# Patient Record
Sex: Male | Born: 1950 | Race: White | Hispanic: No | Marital: Single | Smoking: Former smoker
Health system: Southern US, Community
[De-identification: ages and names within clinical notes are randomized; demographics above are authoritative.]

## PROBLEM LIST (undated history)

## (undated) DIAGNOSIS — R739 Hyperglycemia, unspecified: Secondary | ICD-10-CM

## (undated) DIAGNOSIS — E782 Mixed hyperlipidemia: Secondary | ICD-10-CM

## (undated) DIAGNOSIS — E039 Hypothyroidism, unspecified: Secondary | ICD-10-CM

## (undated) DIAGNOSIS — M419 Scoliosis, unspecified: Secondary | ICD-10-CM

## (undated) DIAGNOSIS — Z79891 Long term (current) use of opiate analgesic: Secondary | ICD-10-CM

## (undated) DIAGNOSIS — N3941 Urge incontinence: Secondary | ICD-10-CM

## (undated) HISTORY — DX: Long term (current) use of opiate analgesic: Z79.891

## (undated) HISTORY — PX: HEMORRHOID SURGERY: SHX153

## (undated) HISTORY — DX: Hyperglycemia, unspecified: R73.9

## (undated) HISTORY — DX: Scoliosis, unspecified: M41.9

## (undated) HISTORY — PX: HERNIA REPAIR: SHX51

## (undated) HISTORY — PX: RHINOPLASTY: SUR1284

## (undated) HISTORY — DX: Urge incontinence: N39.41

## (undated) HISTORY — DX: Morbid (severe) obesity due to excess calories: E66.01

## (undated) HISTORY — DX: Mixed hyperlipidemia: E78.2

## (undated) HISTORY — PX: GASTRIC BYPASS: SHX52

## (undated) HISTORY — DX: Hypothyroidism, unspecified: E03.9

---

## 2005-09-28 ENCOUNTER — Encounter: Admission: RE | Admit: 2005-09-28 | Discharge: 2005-09-28 | Payer: Self-pay | Admitting: Neurosurgery

## 2012-07-28 DIAGNOSIS — Z9884 Bariatric surgery status: Secondary | ICD-10-CM | POA: Insufficient documentation

## 2012-07-28 DIAGNOSIS — M419 Scoliosis, unspecified: Secondary | ICD-10-CM | POA: Insufficient documentation

## 2012-07-28 DIAGNOSIS — M1990 Unspecified osteoarthritis, unspecified site: Secondary | ICD-10-CM | POA: Insufficient documentation

## 2012-07-28 DIAGNOSIS — E785 Hyperlipidemia, unspecified: Secondary | ICD-10-CM | POA: Insufficient documentation

## 2012-07-28 DIAGNOSIS — G8929 Other chronic pain: Secondary | ICD-10-CM | POA: Insufficient documentation

## 2012-07-28 DIAGNOSIS — R2681 Unsteadiness on feet: Secondary | ICD-10-CM | POA: Insufficient documentation

## 2013-10-12 ENCOUNTER — Other Ambulatory Visit: Payer: Self-pay | Admitting: *Deleted

## 2013-10-12 DIAGNOSIS — M542 Cervicalgia: Secondary | ICD-10-CM

## 2013-10-16 ENCOUNTER — Other Ambulatory Visit: Payer: Self-pay

## 2013-10-21 ENCOUNTER — Ambulatory Visit
Admission: RE | Admit: 2013-10-21 | Discharge: 2013-10-21 | Disposition: A | Discharge status: Home / Self Care | Payer: Medicare Other | Source: Ambulatory Visit | Attending: *Deleted | Admitting: *Deleted

## 2013-10-21 DIAGNOSIS — M542 Cervicalgia: Secondary | ICD-10-CM

## 2013-12-14 ENCOUNTER — Other Ambulatory Visit: Payer: Self-pay | Admitting: *Deleted

## 2013-12-14 DIAGNOSIS — M549 Dorsalgia, unspecified: Secondary | ICD-10-CM

## 2013-12-26 ENCOUNTER — Ambulatory Visit
Admission: RE | Admit: 2013-12-26 | Discharge: 2013-12-26 | Disposition: A | Discharge status: Home / Self Care | Payer: Medicare Other | Source: Ambulatory Visit | Attending: *Deleted | Admitting: *Deleted

## 2013-12-26 DIAGNOSIS — M549 Dorsalgia, unspecified: Secondary | ICD-10-CM

## 2015-07-23 ENCOUNTER — Inpatient Hospital Stay: Admission: RE | Admit: 2015-07-23 | Payer: Self-pay | Source: Ambulatory Visit

## 2015-07-23 ENCOUNTER — Other Ambulatory Visit: Payer: Self-pay | Admitting: Family Medicine

## 2015-07-23 DIAGNOSIS — R319 Hematuria, unspecified: Secondary | ICD-10-CM

## 2018-03-30 DIAGNOSIS — I83893 Varicose veins of bilateral lower extremities with other complications: Secondary | ICD-10-CM | POA: Insufficient documentation

## 2018-03-30 DIAGNOSIS — I872 Venous insufficiency (chronic) (peripheral): Secondary | ICD-10-CM | POA: Insufficient documentation

## 2018-12-13 DIAGNOSIS — M25559 Pain in unspecified hip: Secondary | ICD-10-CM | POA: Diagnosis not present

## 2018-12-13 DIAGNOSIS — M19079 Primary osteoarthritis, unspecified ankle and foot: Secondary | ICD-10-CM | POA: Diagnosis not present

## 2018-12-13 DIAGNOSIS — R262 Difficulty in walking, not elsewhere classified: Secondary | ICD-10-CM | POA: Diagnosis not present

## 2018-12-13 DIAGNOSIS — G890 Central pain syndrome: Secondary | ICD-10-CM | POA: Diagnosis not present

## 2019-01-17 DIAGNOSIS — M25569 Pain in unspecified knee: Secondary | ICD-10-CM | POA: Diagnosis not present

## 2019-01-17 DIAGNOSIS — M19079 Primary osteoarthritis, unspecified ankle and foot: Secondary | ICD-10-CM | POA: Diagnosis not present

## 2019-01-17 DIAGNOSIS — R262 Difficulty in walking, not elsewhere classified: Secondary | ICD-10-CM | POA: Diagnosis not present

## 2019-01-17 DIAGNOSIS — G890 Central pain syndrome: Secondary | ICD-10-CM | POA: Diagnosis not present

## 2019-02-20 DIAGNOSIS — J301 Allergic rhinitis due to pollen: Secondary | ICD-10-CM | POA: Diagnosis not present

## 2019-02-23 DIAGNOSIS — Z03818 Encounter for observation for suspected exposure to other biological agents ruled out: Secondary | ICD-10-CM | POA: Diagnosis not present

## 2019-03-15 DIAGNOSIS — M25559 Pain in unspecified hip: Secondary | ICD-10-CM | POA: Diagnosis not present

## 2019-03-15 DIAGNOSIS — M19079 Primary osteoarthritis, unspecified ankle and foot: Secondary | ICD-10-CM | POA: Diagnosis not present

## 2019-03-15 DIAGNOSIS — G890 Central pain syndrome: Secondary | ICD-10-CM | POA: Diagnosis not present

## 2019-03-16 DIAGNOSIS — G8929 Other chronic pain: Secondary | ICD-10-CM | POA: Diagnosis not present

## 2019-03-16 DIAGNOSIS — M1712 Unilateral primary osteoarthritis, left knee: Secondary | ICD-10-CM | POA: Diagnosis not present

## 2019-03-16 DIAGNOSIS — M5106 Intervertebral disc disorders with myelopathy, lumbar region: Secondary | ICD-10-CM | POA: Diagnosis not present

## 2019-03-16 DIAGNOSIS — E039 Hypothyroidism, unspecified: Secondary | ICD-10-CM | POA: Diagnosis not present

## 2019-03-16 DIAGNOSIS — E782 Mixed hyperlipidemia: Secondary | ICD-10-CM | POA: Diagnosis not present

## 2019-04-18 DIAGNOSIS — G890 Central pain syndrome: Secondary | ICD-10-CM | POA: Diagnosis not present

## 2019-04-18 DIAGNOSIS — M25559 Pain in unspecified hip: Secondary | ICD-10-CM | POA: Diagnosis not present

## 2019-04-18 DIAGNOSIS — M19079 Primary osteoarthritis, unspecified ankle and foot: Secondary | ICD-10-CM | POA: Diagnosis not present

## 2019-05-03 DIAGNOSIS — E039 Hypothyroidism, unspecified: Secondary | ICD-10-CM | POA: Diagnosis not present

## 2019-05-17 DIAGNOSIS — M19079 Primary osteoarthritis, unspecified ankle and foot: Secondary | ICD-10-CM | POA: Diagnosis not present

## 2019-05-17 DIAGNOSIS — M25559 Pain in unspecified hip: Secondary | ICD-10-CM | POA: Diagnosis not present

## 2019-05-17 DIAGNOSIS — G890 Central pain syndrome: Secondary | ICD-10-CM | POA: Diagnosis not present

## 2019-06-18 DIAGNOSIS — M25559 Pain in unspecified hip: Secondary | ICD-10-CM | POA: Diagnosis not present

## 2019-06-18 DIAGNOSIS — M19079 Primary osteoarthritis, unspecified ankle and foot: Secondary | ICD-10-CM | POA: Diagnosis not present

## 2019-06-18 DIAGNOSIS — G890 Central pain syndrome: Secondary | ICD-10-CM | POA: Diagnosis not present

## 2019-07-16 DIAGNOSIS — E782 Mixed hyperlipidemia: Secondary | ICD-10-CM | POA: Diagnosis not present

## 2019-07-16 DIAGNOSIS — R1032 Left lower quadrant pain: Secondary | ICD-10-CM | POA: Diagnosis not present

## 2019-07-16 DIAGNOSIS — G8929 Other chronic pain: Secondary | ICD-10-CM | POA: Diagnosis not present

## 2019-07-16 DIAGNOSIS — M5106 Intervertebral disc disorders with myelopathy, lumbar region: Secondary | ICD-10-CM | POA: Diagnosis not present

## 2019-07-16 DIAGNOSIS — E039 Hypothyroidism, unspecified: Secondary | ICD-10-CM | POA: Diagnosis not present

## 2019-07-16 DIAGNOSIS — Z1159 Encounter for screening for other viral diseases: Secondary | ICD-10-CM | POA: Diagnosis not present

## 2019-07-23 DIAGNOSIS — M19079 Primary osteoarthritis, unspecified ankle and foot: Secondary | ICD-10-CM | POA: Diagnosis not present

## 2019-07-23 DIAGNOSIS — M25559 Pain in unspecified hip: Secondary | ICD-10-CM | POA: Diagnosis not present

## 2019-07-23 DIAGNOSIS — G890 Central pain syndrome: Secondary | ICD-10-CM | POA: Diagnosis not present

## 2019-08-24 DIAGNOSIS — Z79891 Long term (current) use of opiate analgesic: Secondary | ICD-10-CM | POA: Diagnosis not present

## 2019-08-24 DIAGNOSIS — R262 Difficulty in walking, not elsewhere classified: Secondary | ICD-10-CM | POA: Diagnosis not present

## 2019-08-24 DIAGNOSIS — G890 Central pain syndrome: Secondary | ICD-10-CM | POA: Diagnosis not present

## 2019-08-24 DIAGNOSIS — M19079 Primary osteoarthritis, unspecified ankle and foot: Secondary | ICD-10-CM | POA: Diagnosis not present

## 2019-09-21 DIAGNOSIS — G890 Central pain syndrome: Secondary | ICD-10-CM | POA: Diagnosis not present

## 2019-09-21 DIAGNOSIS — Z79891 Long term (current) use of opiate analgesic: Secondary | ICD-10-CM | POA: Diagnosis not present

## 2019-09-21 DIAGNOSIS — M19079 Primary osteoarthritis, unspecified ankle and foot: Secondary | ICD-10-CM | POA: Diagnosis not present

## 2019-09-21 DIAGNOSIS — M25569 Pain in unspecified knee: Secondary | ICD-10-CM | POA: Diagnosis not present

## 2019-10-01 DIAGNOSIS — Z23 Encounter for immunization: Secondary | ICD-10-CM | POA: Diagnosis not present

## 2019-10-22 DIAGNOSIS — R262 Difficulty in walking, not elsewhere classified: Secondary | ICD-10-CM | POA: Diagnosis not present

## 2019-10-22 DIAGNOSIS — Z79891 Long term (current) use of opiate analgesic: Secondary | ICD-10-CM | POA: Diagnosis not present

## 2019-10-22 DIAGNOSIS — M4712 Other spondylosis with myelopathy, cervical region: Secondary | ICD-10-CM | POA: Diagnosis not present

## 2019-10-22 DIAGNOSIS — M25559 Pain in unspecified hip: Secondary | ICD-10-CM | POA: Diagnosis not present

## 2019-11-20 DIAGNOSIS — M25559 Pain in unspecified hip: Secondary | ICD-10-CM | POA: Diagnosis not present

## 2019-11-20 DIAGNOSIS — Z79891 Long term (current) use of opiate analgesic: Secondary | ICD-10-CM | POA: Diagnosis not present

## 2019-11-20 DIAGNOSIS — M4712 Other spondylosis with myelopathy, cervical region: Secondary | ICD-10-CM | POA: Diagnosis not present

## 2019-11-20 DIAGNOSIS — R262 Difficulty in walking, not elsewhere classified: Secondary | ICD-10-CM | POA: Diagnosis not present

## 2020-02-26 ENCOUNTER — Other Ambulatory Visit: Payer: Self-pay | Admitting: Legal Medicine

## 2020-03-20 DIAGNOSIS — G890 Central pain syndrome: Secondary | ICD-10-CM | POA: Diagnosis not present

## 2020-03-20 DIAGNOSIS — R262 Difficulty in walking, not elsewhere classified: Secondary | ICD-10-CM | POA: Diagnosis not present

## 2020-03-20 DIAGNOSIS — Z79891 Long term (current) use of opiate analgesic: Secondary | ICD-10-CM | POA: Diagnosis not present

## 2020-03-20 DIAGNOSIS — M19079 Primary osteoarthritis, unspecified ankle and foot: Secondary | ICD-10-CM | POA: Diagnosis not present

## 2020-04-01 ENCOUNTER — Ambulatory Visit (INDEPENDENT_AMBULATORY_CARE_PROVIDER_SITE_OTHER): Payer: Medicare Other | Admitting: Legal Medicine

## 2020-04-01 ENCOUNTER — Other Ambulatory Visit: Payer: Self-pay

## 2020-04-01 ENCOUNTER — Encounter: Payer: Self-pay | Admitting: Legal Medicine

## 2020-04-01 DIAGNOSIS — Z79891 Long term (current) use of opiate analgesic: Secondary | ICD-10-CM

## 2020-04-01 DIAGNOSIS — N3941 Urge incontinence: Secondary | ICD-10-CM | POA: Diagnosis not present

## 2020-04-01 DIAGNOSIS — E039 Hypothyroidism, unspecified: Secondary | ICD-10-CM | POA: Diagnosis not present

## 2020-04-01 DIAGNOSIS — R739 Hyperglycemia, unspecified: Secondary | ICD-10-CM | POA: Diagnosis not present

## 2020-04-01 DIAGNOSIS — E782 Mixed hyperlipidemia: Secondary | ICD-10-CM | POA: Diagnosis not present

## 2020-04-01 DIAGNOSIS — Z6841 Body Mass Index (BMI) 40.0 and over, adult: Secondary | ICD-10-CM

## 2020-04-01 DIAGNOSIS — R2232 Localized swelling, mass and lump, left upper limb: Secondary | ICD-10-CM

## 2020-04-01 MED ORDER — FAMOTIDINE 40 MG PO TABS: 40.0000 mg | ORAL_TABLET | Freq: Every day | ORAL | 2 refills | Status: DC

## 2020-04-01 NOTE — Assessment & Plan Note (Signed)
An individualize plan was formulated using patient history and physical exam to encourage weight loss.  An evidence based program was formulated.  Patient is to cut portion size with meals and to plan physical exercise 3 days a week at least 20 minutes.  Weight watchers and other programs are helpful.  Planned amount of weight loss 20 lbs. 

## 2020-04-01 NOTE — Assessment & Plan Note (Signed)
AN INDIVIDUAL CARE PLAN for incontinence of urine was established and reinforced today.  The patient's status was assessed using clinical findings on exam, labs, and other diagnostic testing. Patient's success at meeting treatment goals based on disease specific evidence-bassed guidelines and found to be in good control. RECOMMENDATIONS include maintaining present medicines and treatment.

## 2020-04-01 NOTE — Assessment & Plan Note (Signed)

## 2020-04-01 NOTE — Assessment & Plan Note (Signed)
New mass left forearm, get ultrasound but we discussed it may need removing.

## 2020-04-01 NOTE — Progress Notes (Signed)
Established Patient Office Visit  Subjective:  Patient ID: Antonio Evans, male    DOB: 11-16-1951  Age: 69 y.o. MRN: [id number]  CC:  Chief Complaint  Patient presents with  . Hypothyroidism  . Hyperlipidemia  . Hyperglycemia    HPI Antonio Evans presents for Chronic visit.  Patient has HYPOTHYROIDISM.  Diagnosed 10 years ago.  Patient has stable thyroid readings.  Patient is having no symptoms.  Last TSH was 4.52.  continue dosage of thyroid medicine.  Patient presents for follow up of hypertension.  Patient tolerating metoazone well with side effects.  Patient was diagnosed with hypertension 2010 so has been treated for hypertension for 10 years.Patient is working on maintaining diet and exercise regimen and follows up as directed. Complication include none  He has hyperglycemia in past..  Patient noted enlarging mass left forearm if is 3 by 6-7 cm.  It is firm but freely mobile.  Non tender.  Past Medical History:  Diagnosis Date  . Hyperglycemia   . Hypothyroidism   . 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 Mixed hyperlipidemia   . Morbid obesity due to excess calories ([place])   . Scoliosis   . Urge incontinence     Past Surgical History:  Procedure Laterality Date  . GASTRIC BYPASS N/A   . HEMORRHOID SURGERY    . HERNIA REPAIR    . RHINOPLASTY      Family History  Problem Relation Age of Onset  . COPD Mother   . Lung cancer Father   . Diabetes Brother     Social History   Socioeconomic History  . Marital status: Single    Spouse name: Not on file  . Number of children: Not on file  . Years of education: Not on file  . Highest education level: Not on file  Occupational History  . Not on file  Tobacco Use  . Smoking status: Former Smoker    Types: Cigarettes    Quit date: 1984    Years since quitting: 37.3  . Smokeless tobacco: Never Used  Substance and Sexual Activity  . Alcohol use: Not Currently  . Drug use: Never  . Sexual activity:  Not on file  Other Topics Concern  . Not on file  Social History Narrative  . Not on file   Social Determinants of Health   Financial Resource Strain:   . Difficulty of Paying Living Expenses:   Food Insecurity:   . Worried About Charity fundraiser in the Last Year:   . Arboriculturist in the Last Year:   Transportation Needs:   . Film/video editor (Medical):   Marland Kitchen Lack of Transportation (Non-Medical):   Physical Activity:   . Days of Exercise per Week:   . Minutes of Exercise per Session:   Stress:   . Feeling of Stress :   Social Connections:   . Frequency of Communication with Friends and Family:   . Frequency of Social Gatherings with Friends and Family:   . Attends Religious Services:   . Active Member of Clubs or Organizations:   . Attends Archivist Meetings:   Marland Kitchen Marital Status:   Intimate Partner Violence:   . Fear of Current or Ex-Partner:   . Emotionally Abused:   Marland Kitchen Physically Abused:   . Sexually Abused:     Outpatient Medications Prior to Visit  Medication Sig Dispense Refill  . aspirin 81 MG EC tablet Take by mouth.    Marland Kitchen  Cholecalciferol 125 MCG (5000 UT) capsule Take by mouth.    . cyanocobalamin 1000 MCG tablet Take by mouth.    . famotidine (PEPCID) 40 MG tablet Take by mouth.    . furosemide (LASIX) 20 MG tablet     . levothyroxine (SYNTHROID) 150 MCG tablet     . metolazone (ZAROXOLYN) 5 MG tablet     . Oxycodone HCl 20 MG TABS     . potassium chloride SA (KLOR-CON) 20 MEQ tablet Take by mouth.    . pravastatin (PRAVACHOL) 40 MG tablet TAKE 1 TABLET BY MOUTH  TWICE DAILY 180 tablet 2  . tiZANidine (ZANAFLEX) 4 MG tablet Take by mouth.    . flunisolide (NASALIDE) 25 MCG/ACT (0.025%) SOLN Place into the nose.    . fluticasone (FLONASE) 50 MCG/ACT nasal spray USE 2 SPRAYS IN EACH NOSTRIL EVERY DAY    . Methylnaltrexone Bromide 150 MG TABS Take by mouth.    . montelukast (SINGULAIR) 10 MG tablet TAKE 1 TABLET EVERY NIGHT     No  facility-administered medications prior to visit.    Allergies  Allergen Reactions  . Enoxaparin Other (See Comments)    Other reaction(s): Bleeding (intolerance)  Other reaction(s): Bleeding (intolerance) Other reaction(s): Bleeding (intolerance)   . Pollen Extract Shortness Of Breath and Swelling  . Gabapentin Other (See Comments)    GI upset Other reaction(s): Other (See Comments) GI upset  GI upset Other reaction(s): Other (See Comments) GI upset Other reaction(s): Other (See Comments) GI upset GI upset   . Levofloxacin Rash  . Sulfamethoxazole-Trimethoprim Other (See Comments)    UNK  . Sulfamethoxazole Other (See Comments)    Other reaction(s): Other (See Comments)     ROS Review of Systems  Constitutional: Negative.   HENT: Negative.   Eyes: Negative.   Respiratory: Negative.   Cardiovascular: Negative.   Gastrointestinal: Negative.   Endocrine: Negative.   Genitourinary: Negative.   Musculoskeletal: Negative.   Skin:       Left forearm mass  Neurological: Negative.   Psychiatric/Behavioral: Negative.       Objective:    Physical Exam  Constitutional: He is oriented to person, place, and time. He appears well-developed and well-nourished.  HENT:  Head: Normocephalic and atraumatic.  Right Ear: External ear normal.  Left Ear: External ear normal.  Nose: Nose normal.  Mouth/Throat: Oropharynx is clear and moist.  Eyes: Pupils are equal, round, and reactive to light. Conjunctivae and EOM are normal.  Cardiovascular: Normal rate, regular rhythm, normal heart sounds and intact distal pulses.  Pulmonary/Chest: Effort normal and breath sounds normal.  Abdominal: Soft. Bowel sounds are normal.  Musculoskeletal:        General: Normal range of motion.     Cervical back: Normal range of motion and neck supple.     Comments: Left forearm mass, 3 by 6 to 7 cm, firm but mobile.  Nontender  Neurological: He is alert and oriented to person, place, and time.  He has normal reflexes.  Skin: Skin is warm and dry.  Psychiatric: He has a normal mood and affect. His behavior is normal. Thought content normal.  Vitals reviewed.   BP (!) 150/78   Pulse 70   Temp (!) 97 F (36.1 C)   Resp 16   Ht 6' (1.829 m)   Wt (!) 308 lb 6.4 oz (139.9 kg)   SpO2 95%   BMI 41.83 kg/m  Wt Readings from Last 3 Encounters:  04/01/20 (!) 308 lb 6.4   oz (139.9 kg)     Health Maintenance Due  Topic Date Due  . Hepatitis C Screening  Never done  . COVID-19 Vaccine (1) Never done  . COLONOSCOPY  Never done  . PNA vac Low Risk Adult (1 of 2 - PCV13) Never done    There are no preventive care reminders to display for this patient.  No results found for: TSH No results found for: WBC, HGB, HCT, MCV, PLT No results found for: NA, K, CHLORIDE, CO2, GLUCOSE, BUN, CREATININE, BILITOT, ALKPHOS, AST, ALT, PROT, ALBUMIN, CALCIUM, ANIONGAP, EGFR, GFR No results found for: CHOL No results found for: HDL No results found for: LDLCALC No results found for: TRIG No results found for: CHOLHDL No results found for: HGBA1C    Assessment & Plan:   Problem List Items Addressed This Visit      Endocrine   Hypothyroidism    Patient is known to have hypothyroid and is n treatment with levothyroxine 150.  Patient was diagnosed 10 years ago.  Other treatment includes none.  Patient is compliant with medicines and last TSH 6 months ago.  Last TSH was 4.52.      Relevant Medications   levothyroxine (SYNTHROID) 150 MCG tablet   Other Relevant Orders   TSH     Other   Hyperglycemia    Patient has been having high glucose but not diabetic, we will recheck A1c.      Relevant Orders   Hemoglobin A1c   Urge incontinence    AN INDIVIDUAL CARE PLAN for incontinence of urine was established and reinforced today.  The patient's status was assessed using clinical findings on exam, labs, and other diagnostic testing. Patient's success at meeting treatment goals based on  disease specific evidence-bassed guidelines and found to be in good control. RECOMMENDATIONS include maintaining present medicines and treatment.      Mixed hyperlipidemia    AN INDIVIDUAL CARE PLAN for hyperlipidemia/ cholesterol was established and reinforced today.  The patient's status was assessed using clinical findings on exam, lab and other diagnostic tests. The patient's disease status was assessed based on evidence-based guidelines and found to be well controlled. MEDICATIONS were reviewed. SELF MANAGEMENT GOALS have been discussed and patient's success at attaining the goal of low cholesterol was assessed. RECOMMENDATION given include regular exercise 3 days a week and low cholesterol/low fat diet. CLINICAL SUMMARY including written plan to identify barriers unique to the patient due to social or economic  reasons was discussed.      Relevant Medications   aspirin 81 MG EC tablet   furosemide (LASIX) 20 MG tablet   metolazone (ZAROXOLYN) 5 MG tablet   Other Relevant Orders   CBC with Differential/Platelet   Comprehensive metabolic panel   Lipid panel   Morbid obesity due to excess calories [facility])    An individualize plan was formulated using patient history and physical exam to encourage weight loss.  An evidence based program was formulated.  Patient is to cut portion size with meals and to plan physical exercise 3 days a week at least 20 minutes.  Weight watchers and other programs are helpful.  Planned amount of weight loss 20 lbs.      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  The patient's status was assessed using clinical findings on exam, labs, and other diagnostic testing. Patient's success at meeting treatment goals based on disease specific evidence-bassed guidelines and found  to be in fair control. RECOMMENDATIONS include maintining present medicines and treatment. He is on chronicoxycodone with no  abuse.  Negative REMS.      BMI 40.0-44.9, adult ([facility])    An individualize plan was formulated using patient history and physical exam to encourage weight loss.  An evidence based program was formulated.  Patient is to cut portion size with meals and to plan physical exercise 3 days a week at least 20 minutes.  Weight watchers and other programs are helpful.  Planned amount of weight loss 20 lbs.      Mass of left forearm    New mass left forearm, get ultrasound but we discussed it may need removing.      Relevant Orders   US UPPER EXTREMITY DUPLEX LEFT (NON-WBI)      No orders of the defined types were placed in this encounter.   Follow-up: Return in about 4 months (around 08/02/2020) for fasting.    Lawrence Perry, MD 

## 2020-04-01 NOTE — Assessment & Plan Note (Signed)
Patient has been having high glucose but not diabetic, we will recheck A1c.

## 2020-04-01 NOTE — Assessment & Plan Note (Signed)
AN INDIVIDUAL CARE PLAN was established and reinforced today.  The patient's status was assessed using clinical findings on exam, labs, and other diagnostic testing. Patient's success at meeting treatment goals based on disease specific evidence-bassed guidelines and found to be in fair control. RECOMMENDATIONS include maintining present medicines and treatment. He is on chronicoxycodone with no abuse.  Negative REMS. 

## 2020-04-01 NOTE — Assessment & Plan Note (Signed)
Patient is known to have hypothyroid and is n treatment with levothyroxine 150.  Patient was diagnosed 10 years ago.  Other treatment includes none.  Patient is compliant with medicines and last TSH 6 months ago.  Last TSH was 4.52.

## 2020-04-02 LAB — COMPREHENSIVE METABOLIC PANEL
ALT: 33 IU/L (ref 0–44)
AST: 29 IU/L (ref 0–40)
Albumin/Globulin Ratio: 2 (ref 1.2–2.2)
Albumin: 4.5 g/dL (ref 3.8–4.8)
Alkaline Phosphatase: 119 IU/L — ABNORMAL HIGH (ref 39–117)
BUN/Creatinine Ratio: 17 (ref 10–24)
BUN: 13 mg/dL (ref 8–27)
Bilirubin Total: 0.6 mg/dL (ref 0.0–1.2)
CO2: 25 mmol/L (ref 20–29)
Calcium: 9.7 mg/dL (ref 8.6–10.2)
Chloride: 104 mmol/L (ref 96–106)
Creatinine, Ser: 0.75 mg/dL — ABNORMAL LOW (ref 0.76–1.27)
GFR calc Af Amer: 110 mL/min/{1.73_m2} (ref >59)
GFR calc non Af Amer: 95 mL/min/{1.73_m2} (ref >59)
Globulin, Total: 2.2 g/dL (ref 1.5–4.5)
Glucose: 91 mg/dL (ref 65–99)
Potassium: 4.1 mmol/L (ref 3.5–5.2)
Sodium: 141 mmol/L (ref 134–144)
Total Protein: 6.7 g/dL (ref 6.0–8.5)

## 2020-04-02 LAB — CBC WITH DIFFERENTIAL/PLATELET
Basophils Absolute: 0.1 10*3/uL (ref 0.0–0.2)
Basos: 1 %
EOS (ABSOLUTE): 0.3 10*3/uL (ref 0.0–0.4)
Eos: 5 %
Hematocrit: 41.3 % (ref 37.5–51.0)
Hemoglobin: 13.3 g/dL (ref 13.0–17.7)
Immature Grans (Abs): 0 10*3/uL (ref 0.0–0.1)
Immature Granulocytes: 0 %
Lymphocytes Absolute: 0.9 10*3/uL (ref 0.7–3.1)
Lymphs: 17 %
MCH: 26.8 pg (ref 26.6–33.0)
MCHC: 32.2 g/dL (ref 31.5–35.7)
MCV: 83 fL (ref 79–97)
Monocytes Absolute: 0.5 10*3/uL (ref 0.1–0.9)
Monocytes: 9 %
Neutrophils Absolute: 3.5 10*3/uL (ref 1.4–7.0)
Neutrophils: 68 %
Platelets: 220 10*3/uL (ref 150–450)
RBC: 4.96 x10E6/uL (ref 4.14–5.80)
RDW: 13.3 % (ref 11.6–15.4)
WBC: 5.3 10*3/uL (ref 3.4–10.8)

## 2020-04-02 LAB — CARDIOVASCULAR RISK ASSESSMENT

## 2020-04-02 LAB — HEMOGLOBIN A1C
Est. average glucose Bld gHb Est-mCnc: 103 mg/dL
Hgb A1c MFr Bld: 5.2 % (ref 4.8–5.6)

## 2020-04-02 LAB — LIPID PANEL
Chol/HDL Ratio: 2.8 ratio (ref 0.0–5.0)
Cholesterol, Total: 102 mg/dL (ref 100–199)
HDL: 37 mg/dL — ABNORMAL LOW (ref >39)
LDL Chol Calc (NIH): 48 mg/dL (ref 0–99)
Triglycerides: 84 mg/dL (ref 0–149)
VLDL Cholesterol Cal: 17 mg/dL (ref 5–40)

## 2020-04-02 LAB — TSH: TSH: 3.12 u[IU]/mL (ref 0.450–4.500)

## 2020-04-02 NOTE — Progress Notes (Signed)
CBC normal, kidney tests normal, liver tests normal, A1c5.2, Cholesterol normal. lp

## 2020-04-07 ENCOUNTER — Other Ambulatory Visit: Payer: Self-pay

## 2020-04-07 MED ORDER — FUROSEMIDE 40 MG PO TABS: 40.0000 mg | ORAL_TABLET | Freq: Every day | ORAL | 1 refills | Status: DC | PRN

## 2020-04-08 ENCOUNTER — Encounter: Payer: Self-pay | Admitting: Legal Medicine

## 2020-04-08 ENCOUNTER — Other Ambulatory Visit: Payer: Self-pay

## 2020-04-08 ENCOUNTER — Ambulatory Visit (INDEPENDENT_AMBULATORY_CARE_PROVIDER_SITE_OTHER): Payer: Medicare Other | Admitting: Legal Medicine

## 2020-04-08 VITALS — BP 110/60 | HR 66 | Temp 97.3°F | Resp 18 | Ht 72.0 in | Wt 308.0 lb

## 2020-04-08 DIAGNOSIS — M545 Low back pain, unspecified: Secondary | ICD-10-CM

## 2020-04-08 DIAGNOSIS — M549 Dorsalgia, unspecified: Secondary | ICD-10-CM | POA: Insufficient documentation

## 2020-04-08 LAB — POCT URINALYSIS DIPSTICK
Bilirubin, UA: 1
Blood, UA: NEGATIVE
Glucose, UA: NEGATIVE
Ketones, UA: NEGATIVE
Leukocytes, UA: NEGATIVE
Nitrite, UA: NEGATIVE
Protein, UA: NEGATIVE
Spec Grav, UA: 1.025 (ref 1.010–1.025)
Urobilinogen, UA: 0.2 E.U./dL
pH, UA: 5.5 (ref 5.0–8.0)

## 2020-04-08 MED ORDER — KETOROLAC TROMETHAMINE 60 MG/2ML IM SOLN: 60.0000 mg | Freq: Once | INTRAMUSCULAR | Status: DC

## 2020-04-08 NOTE — Assessment & Plan Note (Signed)
Patient presented with left low back pain radiation to abdomen.  He has history of renal calculi but denies any back injury.  Plan is to get ct abdomen and pelvis with contrast tomorrow.

## 2020-04-08 NOTE — Progress Notes (Signed)
Established Patient Office Visit  Subjective:  Patient ID: Antonio Evans, male    DOB: Apr 06, 1951  Age: 69 y.o. MRN: [id number]  CC:  Chief Complaint  Patient presents with  . Back Pain    left lower    HPI Antonio Evans presents for back pain.  He has a history of chronic back pain and degenerative disk disease.  It is on left side radiating to anterior abdomen.  Pain worse with walking and coughing.  No fever or chills. He feels tired.  Eating OK.  Does not feel like old back pain. Pain started last week.  Past Medical History:  Diagnosis Date  . Hyperglycemia   . Hypothyroidism   . 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 Mixed hyperlipidemia   . Morbid obesity due to excess calories ([place])   . Scoliosis   . Urge incontinence     Past Surgical History:  Procedure Laterality Date  . GASTRIC BYPASS N/A   . HEMORRHOID SURGERY    . HERNIA REPAIR    . RHINOPLASTY      Family History  Problem Relation Age of Onset  . COPD Mother   . Lung cancer Father   . Diabetes Brother     Social History   Socioeconomic History  . Marital status: Single    Spouse name: Not on file  . Number of children: Not on file  . Years of education: Not on file  . Highest education level: Not on file  Occupational History  . Not on file  Tobacco Use  . Smoking status: Former Smoker    Types: Cigarettes    Quit date: 1984    Years since quitting: 37.3  . Smokeless tobacco: Never Used  Substance and Sexual Activity  . Alcohol use: Not Currently  . Drug use: Never  . Sexual activity: Not on file  Other Topics Concern  . Not on file  Social History Narrative  . Not on file   Social Determinants of Health   Financial Resource Strain:   . Difficulty of Paying Living Expenses:   Food Insecurity:   . Worried About Charity fundraiser in the Last Year:   . Arboriculturist in the Last Year:   Transportation Needs:   . Film/video editor (Medical):   Marland Kitchen Lack of  Transportation (Non-Medical):   Physical Activity:   . Days of Exercise per Week:   . Minutes of Exercise per Session:   Stress:   . Feeling of Stress :   Social Connections:   . Frequency of Communication with Friends and Family:   . Frequency of Social Gatherings with Friends and Family:   . Attends Religious Services:   . Active Member of Clubs or Organizations:   . Attends Archivist Meetings:   Marland Kitchen Marital Status:   Intimate Partner Violence:   . Fear of Current or Ex-Partner:   . Emotionally Abused:   Marland Kitchen Physically Abused:   . Sexually Abused:     Outpatient Medications Prior to Visit  Medication Sig Dispense Refill  . aspirin 81 MG EC tablet Take by mouth.    . Cholecalciferol 125 MCG (5000 UT) capsule Take by mouth.    . cyanocobalamin 1000 MCG tablet Take by mouth.    . famotidine (PEPCID) 40 MG tablet Take 1 tablet (40 mg total) by mouth daily. 90 tablet 2  . furosemide (LASIX) 40 MG tablet Take 1 tablet (40 mg  total) by mouth daily as needed. 90 tablet 1  . levothyroxine (SYNTHROID) 150 MCG tablet     . metolazone (ZAROXOLYN) 5 MG tablet     . Oxycodone HCl 20 MG TABS     . potassium chloride SA (KLOR-CON) 20 MEQ tablet Take by mouth.    . pravastatin (PRAVACHOL) 40 MG tablet TAKE 1 TABLET BY MOUTH  TWICE DAILY 180 tablet 2  . tiZANidine (ZANAFLEX) 4 MG tablet Take by mouth.     No facility-administered medications prior to visit.    Allergies  Allergen Reactions  . Enoxaparin Other (See Comments)    Other reaction(s): Bleeding (intolerance)  Other reaction(s): Bleeding (intolerance) Other reaction(s): Bleeding (intolerance)   . Pollen Extract Shortness Of Breath and Swelling  . Gabapentin Other (See Comments)    GI upset Other reaction(s): Other (See Comments) GI upset  GI upset Other reaction(s): Other (See Comments) GI upset Other reaction(s): Other (See Comments) GI upset GI upset   . Levofloxacin Rash  . Sulfamethoxazole-Trimethoprim  Other (See Comments)    UNK  . Sulfamethoxazole Other (See Comments)    Other reaction(s): Other (See Comments)     ROS Review of Systems  Constitutional: Negative.   HENT: Negative.   Eyes: Negative.   Respiratory: Negative.   Cardiovascular: Negative.   Gastrointestinal: Negative.   Genitourinary: Negative.   Musculoskeletal: Positive for back pain.  Neurological: Negative.   Psychiatric/Behavioral: Negative.       Objective:    Physical Exam  Constitutional: He appears well-developed and well-nourished.  HENT:  Head: Normocephalic and atraumatic.  Right Ear: External ear normal.  Left Ear: External ear normal.  Eyes: Pupils are equal, round, and reactive to light. Conjunctivae and EOM are normal.  Cardiovascular: Normal rate, regular rhythm, normal heart sounds and intact distal pulses.  Pulmonary/Chest: Effort normal and breath sounds normal.  Musculoskeletal:     Cervical back: Normal range of motion and neck supple.     Lumbar back: Pain and spasms present.       Back:     Comments: increase pain with SLR.  No deficits  Vitals reviewed.   BP 110/60   Pulse 66   Temp (!) 97.3 F (36.3 C)   Resp 18   Ht 6' (1.829 m)   Wt (!) 308 lb (139.7 kg)   SpO2 95%   BMI 41.77 kg/m  Wt Readings from Last 3 Encounters:  04/08/20 (!) 308 lb (139.7 kg)  04/01/20 (!) 308 lb 6.4 oz (139.9 kg)     Health Maintenance Due  Topic Date Due  . Hepatitis C Screening  Never done  . COVID-19 Vaccine (1) Never done  . COLONOSCOPY  Never done  . PNA vac Low Risk Adult (1 of 2 - PCV13) Never done    There are no preventive care reminders to display for this patient.  Lab Results  Component Value Date   TSH 3.120 04/01/2020   Lab Results  Component Value Date   WBC 5.3 04/01/2020   HGB 13.3 04/01/2020   HCT 41.3 04/01/2020   MCV 83 04/01/2020   PLT 220 04/01/2020   Lab Results  Component Value Date   NA 141 04/01/2020   K 4.1 04/01/2020   CO2 25 04/01/2020    GLUCOSE 91 04/01/2020   BUN 13 04/01/2020   CREATININE 0.75 (L) 04/01/2020   BILITOT 0.6 04/01/2020   ALKPHOS 119 (H) 04/01/2020   AST 29 04/01/2020   ALT 33 04/01/2020  PROT 6.7 04/01/2020   ALBUMIN 4.5 04/01/2020   CALCIUM 9.7 04/01/2020   Lab Results  Component Value Date   CHOL 102 04/01/2020   Lab Results  Component Value Date   HDL 37 (L) 04/01/2020   Lab Results  Component Value Date   LDLCALC 48 04/01/2020   Lab Results  Component Value Date   TRIG 84 04/01/2020   Lab Results  Component Value Date   CHOLHDL 2.8 04/01/2020   Lab Results  Component Value Date   HGBA1C 5.2 04/01/2020      Assessment & Plan:   Problem List Items Addressed This Visit      Other   Back pain - Primary    Patient presented with left low back pain radiation to abdomen.  He has history of renal calculi but denies any back injury.  Plan is to get ct abdomen and pelvis with contrast tomorrow.      Relevant Medications   ketorolac (TORADOL) injection 60 mg   Other Relevant Orders   CT Abdomen Pelvis W Contrast   POCT urinalysis dipstick (Completed)      Meds ordered this encounter  Medications  . ketorolac (TORADOL) injection 60 mg    Follow-up: No follow-ups on file.    [name], MD

## 2020-04-09 ENCOUNTER — Other Ambulatory Visit: Payer: Self-pay

## 2020-04-09 ENCOUNTER — Other Ambulatory Visit: Payer: Self-pay | Admitting: Family Medicine

## 2020-04-09 DIAGNOSIS — Z9884 Bariatric surgery status: Secondary | ICD-10-CM | POA: Diagnosis not present

## 2020-04-09 DIAGNOSIS — M545 Low back pain, unspecified: Secondary | ICD-10-CM

## 2020-04-09 DIAGNOSIS — I700 Atherosclerosis of aorta: Secondary | ICD-10-CM | POA: Diagnosis not present

## 2020-04-09 DIAGNOSIS — R109 Unspecified abdominal pain: Secondary | ICD-10-CM | POA: Diagnosis not present

## 2020-04-09 DIAGNOSIS — N200 Calculus of kidney: Secondary | ICD-10-CM | POA: Diagnosis not present

## 2020-04-09 DIAGNOSIS — M48061 Spinal stenosis, lumbar region without neurogenic claudication: Secondary | ICD-10-CM | POA: Diagnosis not present

## 2020-04-09 NOTE — Telephone Encounter (Signed)
Your pt

## 2020-04-16 DIAGNOSIS — M19079 Primary osteoarthritis, unspecified ankle and foot: Secondary | ICD-10-CM | POA: Diagnosis not present

## 2020-04-16 DIAGNOSIS — M4712 Other spondylosis with myelopathy, cervical region: Secondary | ICD-10-CM | POA: Diagnosis not present

## 2020-04-16 DIAGNOSIS — Z79891 Long term (current) use of opiate analgesic: Secondary | ICD-10-CM | POA: Diagnosis not present

## 2020-04-16 DIAGNOSIS — G890 Central pain syndrome: Secondary | ICD-10-CM | POA: Diagnosis not present

## 2020-05-23 DIAGNOSIS — Z79891 Long term (current) use of opiate analgesic: Secondary | ICD-10-CM | POA: Diagnosis not present

## 2020-05-23 DIAGNOSIS — G890 Central pain syndrome: Secondary | ICD-10-CM | POA: Diagnosis not present

## 2020-05-23 DIAGNOSIS — M19079 Primary osteoarthritis, unspecified ankle and foot: Secondary | ICD-10-CM | POA: Diagnosis not present

## 2020-05-23 DIAGNOSIS — M4712 Other spondylosis with myelopathy, cervical region: Secondary | ICD-10-CM | POA: Diagnosis not present

## 2020-05-27 ENCOUNTER — Other Ambulatory Visit: Payer: Self-pay | Admitting: Legal Medicine

## 2020-05-27 ENCOUNTER — Telehealth: Payer: Self-pay

## 2020-05-27 DIAGNOSIS — Z0100 Encounter for examination of eyes and vision without abnormal findings: Secondary | ICD-10-CM

## 2020-05-27 NOTE — Telephone Encounter (Signed)
Patient would like to see opthamologist Dr. Zadie Rhine. States he needs a referral to set up appoinment next week.

## 2020-06-01 ENCOUNTER — Other Ambulatory Visit: Payer: Self-pay | Admitting: Legal Medicine

## 2020-06-03 ENCOUNTER — Other Ambulatory Visit: Payer: Self-pay

## 2020-06-03 MED ORDER — FAMOTIDINE 40 MG PO TABS: 40.0000 mg | ORAL_TABLET | Freq: Two times a day (BID) | ORAL | 2 refills | Status: DC

## 2020-06-03 MED ORDER — TIZANIDINE HCL 4 MG PO TABS: 4.0000 mg | ORAL_TABLET | Freq: Three times a day (TID) | ORAL | 1 refills | Status: DC

## 2020-06-04 DIAGNOSIS — H2513 Age-related nuclear cataract, bilateral: Secondary | ICD-10-CM | POA: Diagnosis not present

## 2020-06-04 DIAGNOSIS — D3131 Benign neoplasm of right choroid: Secondary | ICD-10-CM | POA: Diagnosis not present

## 2020-06-04 DIAGNOSIS — H43393 Other vitreous opacities, bilateral: Secondary | ICD-10-CM | POA: Diagnosis not present

## 2020-06-04 DIAGNOSIS — H43813 Vitreous degeneration, bilateral: Secondary | ICD-10-CM | POA: Diagnosis not present

## 2020-06-24 DIAGNOSIS — M4712 Other spondylosis with myelopathy, cervical region: Secondary | ICD-10-CM | POA: Diagnosis not present

## 2020-06-24 DIAGNOSIS — Z79891 Long term (current) use of opiate analgesic: Secondary | ICD-10-CM | POA: Diagnosis not present

## 2020-06-24 DIAGNOSIS — G890 Central pain syndrome: Secondary | ICD-10-CM | POA: Diagnosis not present

## 2020-06-24 DIAGNOSIS — M19079 Primary osteoarthritis, unspecified ankle and foot: Secondary | ICD-10-CM | POA: Diagnosis not present

## 2020-07-25 DIAGNOSIS — M19079 Primary osteoarthritis, unspecified ankle and foot: Secondary | ICD-10-CM | POA: Diagnosis not present

## 2020-07-25 DIAGNOSIS — G890 Central pain syndrome: Secondary | ICD-10-CM | POA: Diagnosis not present

## 2020-07-25 DIAGNOSIS — M4712 Other spondylosis with myelopathy, cervical region: Secondary | ICD-10-CM | POA: Diagnosis not present

## 2020-07-25 DIAGNOSIS — Z79891 Long term (current) use of opiate analgesic: Secondary | ICD-10-CM | POA: Diagnosis not present

## 2020-07-31 ENCOUNTER — Other Ambulatory Visit: Payer: Self-pay

## 2020-07-31 ENCOUNTER — Encounter: Payer: Self-pay | Admitting: Legal Medicine

## 2020-07-31 ENCOUNTER — Ambulatory Visit (INDEPENDENT_AMBULATORY_CARE_PROVIDER_SITE_OTHER): Payer: Medicare Other | Admitting: Legal Medicine

## 2020-07-31 VITALS — BP 134/82 | HR 100 | Temp 97.4°F | Resp 16 | Ht 72.0 in | Wt 305.0 lb

## 2020-07-31 DIAGNOSIS — E782 Mixed hyperlipidemia: Secondary | ICD-10-CM | POA: Diagnosis not present

## 2020-07-31 DIAGNOSIS — Z23 Encounter for immunization: Secondary | ICD-10-CM

## 2020-07-31 DIAGNOSIS — M545 Low back pain, unspecified: Secondary | ICD-10-CM

## 2020-07-31 DIAGNOSIS — E038 Other specified hypothyroidism: Secondary | ICD-10-CM

## 2020-07-31 DIAGNOSIS — R739 Hyperglycemia, unspecified: Secondary | ICD-10-CM

## 2020-07-31 DIAGNOSIS — Z79891 Long term (current) use of opiate analgesic: Secondary | ICD-10-CM

## 2020-07-31 DIAGNOSIS — I83893 Varicose veins of bilateral lower extremities with other complications: Secondary | ICD-10-CM

## 2020-07-31 MED ORDER — FUROSEMIDE 40 MG PO TABS: 80.0000 mg | ORAL_TABLET | Freq: Every day | ORAL | 2 refills | Status: DC | PRN

## 2020-07-31 NOTE — Progress Notes (Signed)
Subjective:  Patient ID: Antonio Evans, male    DOB: Jun 27, 1951  Age: 69 y.o. MRN: [id number]  Chief Complaint  Patient presents with  . Hyperlipidemia  . Hypothyroidism    HPI: chronic visit  Patient has HYPOTHYROIDISM.  Diagnosed 10 years ago.  Patient has stable thyroid readings.  Patient is having stable.  Last TSH was normal.  continue dosage of thyroid medicine.  Patient presents with hyperlipidemia.  Compliance with treatment has been good; patient takes medicines as directed, maintains low cholesterol diet, follows up as directed, and maintains exercise regimen.  Patient is using pravastatin without problems.   Current Outpatient Medications on File Prior to Visit  Medication Sig Dispense Refill  . aspirin 81 MG EC tablet Take by mouth.    . Cholecalciferol 125 MCG (5000 UT) capsule Take by mouth.    . cyanocobalamin 1000 MCG tablet Take by mouth.    . famotidine (PEPCID) 40 MG tablet Take 1 tablet (40 mg total) by mouth 2 (two) times daily. 180 tablet 2  . levothyroxine (SYNTHROID) 150 MCG tablet TAKE 1 TABLET BY MOUTH ONCE DAILY 90 tablet 3  . metolazone (ZAROXOLYN) 5 MG tablet     . montelukast (SINGULAIR) 10 MG tablet TAKE 1 TABLET EVERY NIGHT    . morphine (MS CONTIN) 60 MG 12 hr tablet SMARTSIG:1 Tablet(s) By Mouth Every 12 Hours    . Multiple Vitamin (MULTI-VITAMIN) tablet Take by mouth.    . Oxycodone HCl 20 MG TABS     . potassium chloride SA (KLOR-CON) 20 MEQ tablet TAKE 1 TABLET BY MOUTH  TWICE DAILY WITH FOOD 180 tablet 3  . pravastatin (PRAVACHOL) 40 MG tablet TAKE 1 TABLET BY MOUTH  TWICE DAILY 180 tablet 2  . tiZANidine (ZANAFLEX) 4 MG tablet Take 1 tablet (4 mg total) by mouth 3 (three) times daily. 405 tablet 1   No current facility-administered medications on file prior to visit.   Past Medical History:  Diagnosis Date  . Hyperglycemia   . Hypothyroidism   . 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 Mixed hyperlipidemia   . Morbid obesity due  to excess calories ([place])   . Scoliosis   . Urge incontinence    Past Surgical History:  Procedure Laterality Date  . GASTRIC BYPASS N/A   . HEMORRHOID SURGERY    . HERNIA REPAIR    . RHINOPLASTY      Family History  Problem Relation Age of Onset  . COPD Mother   . Lung cancer Father   . Diabetes Brother    Social History   Socioeconomic History  . Marital status: Single    Spouse name: Not on file  . Number of children: Not on file  . Years of education: Not on file  . Highest education level: Not on file  Occupational History  . Not on file  Tobacco Use  . Smoking status: Former Smoker    Types: Cigarettes    Quit date: 1984    Years since quitting: 37.6  . Smokeless tobacco: Never Used  Vaping Use  . Vaping Use: Never used  Substance and Sexual Activity  . Alcohol use: Not Currently  . Drug use: Never  . Sexual activity: Not on file  Other Topics Concern  . Not on file  Social History Narrative  . Not on file   Social Determinants of Health   Financial Resource Strain:   . Difficulty of Paying Living Expenses: Not on file  Food Insecurity:   . Worried About Charity fundraiser in the Last Year: Not on file  . Ran Out of Food in the Last Year: Not on file  Transportation Needs:   . Lack of Transportation (Medical): Not on file  . Lack of Transportation (Non-Medical): Not on file  Physical Activity:   . Days of Exercise per Week: Not on file  . Minutes of Exercise per Session: Not on file  Stress:   . Feeling of Stress : Not on file  Social Connections:   . Frequency of Communication with Friends and Family: Not on file  . Frequency of Social Gatherings with Friends and Family: Not on file  . Attends Religious Services: Not on file  . Active Member of Clubs or Organizations: Not on file  . Attends Archivist Meetings: Not on file  . Marital Status: Not on file    Review of Systems  Constitutional: Negative.   HENT: Negative.   Eyes:  Negative.   Respiratory: Negative for cough, shortness of breath and stridor.   Cardiovascular: Negative.  Negative for chest pain, palpitations and leg swelling.  Gastrointestinal: Negative.   Genitourinary: Negative.   Musculoskeletal: Positive for arthralgias and back pain.  Skin: Negative.   Neurological: Negative.   Psychiatric/Behavioral: Negative.      Objective:  BP 134/82   Pulse 100   Temp (!) 97.4 F (36.3 C)   Resp 16   Ht 6' (1.829 m)   Wt (!) 305 lb (138.3 kg)   SpO2 98%   BMI 41.37 kg/m   BP/Weight 07/31/2020 0/86/5784 05/07/6294  Systolic BP 284 132 440  Diastolic BP 82 60 78  Wt. (Lbs) 305 308 308.4  BMI 41.37 41.77 41.83    Physical Exam Vitals reviewed.  Constitutional:      Appearance: Normal appearance.  HENT:     Head: Normocephalic and atraumatic.     Right Ear: Tympanic membrane normal.     Left Ear: Tympanic membrane normal.     Mouth/Throat:     Mouth: Mucous membranes are dry.     Pharynx: Oropharynx is clear.  Eyes:     Extraocular Movements: Extraocular movements intact.     Conjunctiva/sclera: Conjunctivae normal.     Pupils: Pupils are equal, round, and reactive to light.  Cardiovascular:     Rate and Rhythm: Normal rate and regular rhythm.     Pulses: Normal pulses.     Heart sounds: Normal heart sounds.  Pulmonary:     Effort: Pulmonary effort is normal.     Breath sounds: Normal breath sounds.  Abdominal:     General: Abdomen is flat. Bowel sounds are normal.     Palpations: Abdomen is soft.  Musculoskeletal:        General: Normal range of motion.     Cervical back: Normal range of motion and neck supple.  Skin:    General: Skin is warm and dry.     Capillary Refill: Capillary refill takes less than 2 seconds.  Neurological:     General: No focal deficit present.     Mental Status: He is alert and oriented to person, place, and time.  Psychiatric:        Mood and Affect: Mood normal.    Depression screen [facility] 2/9  07/31/2020 07/31/2020  Decreased Interest 1 0  Down, Depressed, Hopeless 1 0  PHQ - 2 Score 2 0  Altered sleeping 3 -  Tired, decreased energy 1 -  Change in appetite 1 -  Feeling bad or failure about yourself  0 -  Trouble concentrating 0 -  Moving slowly or fidgety/restless 0 -  Suicidal thoughts 0 -  PHQ-9 Score 7 -  Difficult doing work/chores Not difficult at all -    Diabetic Foot Exam - Simple   Simple Foot Form Diabetic Foot exam was performed with the following findings: Yes 07/31/2020  2:44 PM  Visual Inspection No deformities, no ulcerations, no other skin breakdown bilaterally: Yes Sensation Testing See comments: Yes Pulse Check See comments: Yes Comments Decreased pulses, no monofilament sensation      Lab Results  Component Value Date   WBC 5.3 04/01/2020   HGB 13.3 04/01/2020   HCT 41.3 04/01/2020   PLT 220 04/01/2020   GLUCOSE 91 04/01/2020   CHOL 102 04/01/2020   TRIG 84 04/01/2020   HDL 37 (L) 04/01/2020   LDLCALC 48 04/01/2020   ALT 33 04/01/2020   AST 29 04/01/2020   NA 141 04/01/2020   K 4.1 04/01/2020   CL 104 04/01/2020   CREATININE 0.75 (L) 04/01/2020   BUN 13 04/01/2020   CO2 25 04/01/2020   TSH 3.120 04/01/2020   HGBA1C 5.2 04/01/2020      Assessment & Plan:   1. Mixed hyperlipidemia - CBC with Differential/Platelet - Comprehensive metabolic panel - Lipid panel AN INDIVIDUAL CARE PLAN for hyperlipidemia/ cholesterol was established and reinforced today.  The patient's status was assessed using clinical findings on exam, lab and other diagnostic tests. The patient's disease status was assessed based on evidence-based guidelines and found to be well controlled. MEDICATIONS were reviewed. SELF MANAGEMENT GOALS have been discussed and patient's success at attaining the goal of low cholesterol was assessed. RECOMMENDATION given include regular exercise 3 days a week and low cholesterol/low fat diet. CLINICAL SUMMARY including written plan  to identify barriers unique to the patient due to social or economic  reasons was discussed.  2. Other specified hypothyroidism - TSH Patient is known to have hypothyroid and is n treatment with levothyroxine 115mcg.  Patient was diagnosed 10 years ago.  Other treatment includes none.  Patient is compliant with medicines and last TSH 6 months ago.  Last TSH was normal.  3. Hyperglycemia - Comprehensive metabolic panel Patient has prediabetes and is wartching diet  4. Acute left-sided low back pain without sciatica Patient has chronic LBP  5. 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 Varicose veins of both legs with edema - furosemide (LASIX) 40 MG tablet; Take 2 tablets (80 mg total) by mouth daily as needed.  Dispense: 180 tablet; Refill: 2 - Ambulatory referral to Vascular Surgery Patient having worsening edema and lymphedema, refer for consult 7. Need for immunization against influenza - Flu Vaccine QUAD High Dose(Fluad)  8. Morbid obesity due to excess calories [facility]) An individualize plan was formulated for obesity using patient history and physical exam to encourage weight loss.  An evidence based program was formulated.  Patient is to cut portion size with meals and to plan physical exercise 3 days a week at least 20 minutes.  Weight watchers and other programs are helpful.  Planned amount of weight loss 10 lbs.    Meds ordered this encounter  Medications  . furosemide (LASIX) 40 MG tablet    Sig: Take 2 tablets (80 mg total) by mouth daily as needed.    Dispense:  180 tablet    Refill:  2    Orders Placed This Encounter  Procedures  . Flu Vaccine QUAD High Dose(Fluad)  . CBC with Differential/Platelet  . Comprehensive metabolic panel  . Lipid panel  . TSH  . Ambulatory referral to Vascular Surgery     Follow-up: Return in about 4 months (around 11/30/2020) for fasting.  An After Visit Summary was printed and given to  the patient.  [place] [phone]

## 2020-08-01 LAB — COMPREHENSIVE METABOLIC PANEL
ALT: 20 IU/L (ref 0–44)
AST: 21 IU/L (ref 0–40)
Albumin/Globulin Ratio: 1.8 (ref 1.2–2.2)
Albumin: 4.4 g/dL (ref 3.8–4.8)
Alkaline Phosphatase: 95 IU/L (ref 48–121)
BUN/Creatinine Ratio: 22 (ref 10–24)
BUN: 19 mg/dL (ref 8–27)
Bilirubin Total: 0.4 mg/dL (ref 0.0–1.2)
CO2: 22 mmol/L (ref 20–29)
Calcium: 9 mg/dL (ref 8.6–10.2)
Chloride: 110 mmol/L — ABNORMAL HIGH (ref 96–106)
Creatinine, Ser: 0.85 mg/dL (ref 0.76–1.27)
GFR calc Af Amer: 104 mL/min/{1.73_m2} (ref >59)
GFR calc non Af Amer: 90 mL/min/{1.73_m2} (ref >59)
Globulin, Total: 2.4 g/dL (ref 1.5–4.5)
Glucose: 87 mg/dL (ref 65–99)
Potassium: 4.3 mmol/L (ref 3.5–5.2)
Sodium: 145 mmol/L — ABNORMAL HIGH (ref 134–144)
Total Protein: 6.8 g/dL (ref 6.0–8.5)

## 2020-08-01 LAB — CBC WITH DIFFERENTIAL/PLATELET
Basophils Absolute: 0.1 10*3/uL (ref 0.0–0.2)
Basos: 1 %
EOS (ABSOLUTE): 0.2 10*3/uL (ref 0.0–0.4)
Eos: 4 %
Hematocrit: 37.9 % (ref 37.5–51.0)
Hemoglobin: 12 g/dL — ABNORMAL LOW (ref 13.0–17.7)
Immature Grans (Abs): 0 10*3/uL (ref 0.0–0.1)
Immature Granulocytes: 0 %
Lymphocytes Absolute: 1.1 10*3/uL (ref 0.7–3.1)
Lymphs: 20 %
MCH: 26.9 pg (ref 26.6–33.0)
MCHC: 31.7 g/dL (ref 31.5–35.7)
MCV: 85 fL (ref 79–97)
Monocytes Absolute: 0.5 10*3/uL (ref 0.1–0.9)
Monocytes: 9 %
Neutrophils Absolute: 3.6 10*3/uL (ref 1.4–7.0)
Neutrophils: 66 %
Platelets: 195 10*3/uL (ref 150–450)
RBC: 4.46 x10E6/uL (ref 4.14–5.80)
RDW: 13.2 % (ref 11.6–15.4)
WBC: 5.5 10*3/uL (ref 3.4–10.8)

## 2020-08-01 LAB — LIPID PANEL
Chol/HDL Ratio: 2.1 ratio (ref 0.0–5.0)
Cholesterol, Total: 93 mg/dL — ABNORMAL LOW (ref 100–199)
HDL: 44 mg/dL (ref >39)
LDL Chol Calc (NIH): 38 mg/dL (ref 0–99)
Triglycerides: 41 mg/dL (ref 0–149)
VLDL Cholesterol Cal: 11 mg/dL (ref 5–40)

## 2020-08-01 LAB — CARDIOVASCULAR RISK ASSESSMENT

## 2020-08-01 LAB — TSH: TSH: 9.32 u[IU]/mL — ABNORMAL HIGH (ref 0.450–4.500)

## 2020-08-01 NOTE — Progress Notes (Signed)
Mild anemia, Kidney and liver tests normal, Cholesterol good, TSH high at 9.32 are you still on levothyroxine 169mcg? lp

## 2020-08-07 ENCOUNTER — Telehealth: Payer: Self-pay

## 2020-08-07 NOTE — Telephone Encounter (Signed)
It is the same as OTC eucerin lp

## 2020-08-22 DIAGNOSIS — Z79891 Long term (current) use of opiate analgesic: Secondary | ICD-10-CM | POA: Diagnosis not present

## 2020-08-22 DIAGNOSIS — M19079 Primary osteoarthritis, unspecified ankle and foot: Secondary | ICD-10-CM | POA: Diagnosis not present

## 2020-08-22 DIAGNOSIS — G890 Central pain syndrome: Secondary | ICD-10-CM | POA: Diagnosis not present

## 2020-08-22 DIAGNOSIS — M4712 Other spondylosis with myelopathy, cervical region: Secondary | ICD-10-CM | POA: Diagnosis not present

## 2020-08-27 DIAGNOSIS — H2513 Age-related nuclear cataract, bilateral: Secondary | ICD-10-CM | POA: Diagnosis not present

## 2020-08-27 DIAGNOSIS — D3131 Benign neoplasm of right choroid: Secondary | ICD-10-CM | POA: Diagnosis not present

## 2020-08-27 DIAGNOSIS — H43393 Other vitreous opacities, bilateral: Secondary | ICD-10-CM | POA: Diagnosis not present

## 2020-08-27 DIAGNOSIS — H43813 Vitreous degeneration, bilateral: Secondary | ICD-10-CM | POA: Diagnosis not present

## 2020-09-02 DIAGNOSIS — I4819 Other persistent atrial fibrillation: Secondary | ICD-10-CM | POA: Diagnosis not present

## 2020-09-02 DIAGNOSIS — Z9884 Bariatric surgery status: Secondary | ICD-10-CM | POA: Diagnosis not present

## 2020-09-02 DIAGNOSIS — R0600 Dyspnea, unspecified: Secondary | ICD-10-CM | POA: Diagnosis not present

## 2020-09-02 DIAGNOSIS — R079 Chest pain, unspecified: Secondary | ICD-10-CM | POA: Diagnosis not present

## 2020-09-03 DIAGNOSIS — I4510 Unspecified right bundle-branch block: Secondary | ICD-10-CM | POA: Diagnosis not present

## 2020-09-03 DIAGNOSIS — I4891 Unspecified atrial fibrillation: Secondary | ICD-10-CM | POA: Diagnosis not present

## 2020-09-18 DIAGNOSIS — I4819 Other persistent atrial fibrillation: Secondary | ICD-10-CM | POA: Diagnosis not present

## 2020-09-22 ENCOUNTER — Other Ambulatory Visit: Payer: Self-pay | Admitting: Family Medicine

## 2020-09-22 DIAGNOSIS — R0600 Dyspnea, unspecified: Secondary | ICD-10-CM | POA: Diagnosis not present

## 2020-09-23 DIAGNOSIS — Z79891 Long term (current) use of opiate analgesic: Secondary | ICD-10-CM | POA: Diagnosis not present

## 2020-09-23 DIAGNOSIS — M4712 Other spondylosis with myelopathy, cervical region: Secondary | ICD-10-CM | POA: Diagnosis not present

## 2020-09-23 DIAGNOSIS — M19079 Primary osteoarthritis, unspecified ankle and foot: Secondary | ICD-10-CM | POA: Diagnosis not present

## 2020-09-23 DIAGNOSIS — G890 Central pain syndrome: Secondary | ICD-10-CM | POA: Diagnosis not present

## 2020-10-15 ENCOUNTER — Ambulatory Visit (INDEPENDENT_AMBULATORY_CARE_PROVIDER_SITE_OTHER): Payer: Medicare Other | Admitting: Legal Medicine

## 2020-10-15 ENCOUNTER — Encounter: Payer: Self-pay | Admitting: Legal Medicine

## 2020-10-15 ENCOUNTER — Other Ambulatory Visit: Payer: Self-pay

## 2020-10-15 VITALS — BP 140/70 | HR 68 | Temp 97.4°F | Resp 18 | Ht 71.0 in | Wt 296.4 lb

## 2020-10-15 DIAGNOSIS — I872 Venous insufficiency (chronic) (peripheral): Secondary | ICD-10-CM

## 2020-10-15 NOTE — Progress Notes (Signed)
Subjective:  Patient ID: Antonio Evans, male    DOB: 10/28/1951  Age: 69 y.o. MRN: [id number]  Chief Complaint  Patient presents with  . Leg Swelling    bilateral    HPI: patients, legs are swelling more now that cardiology changed furosemide to torsemide 20mg  one twice.started 09/22/2020.  He is now having increased edema and weeping on legs.He is having some rhonchi in lungs, he is having orthopnea and PND Patient has chronic venous stasis in both lower legs which has been chronically problem he is also morbidly obese.  The leg is legs are much more swollen than before and draining clear serosanguineous fluid. Current Outpatient Medications on File Prior to Visit  Medication Sig Dispense Refill  . apixaban (ELIQUIS) 5 MG TABS tablet Take 5 mg by mouth 2 (two) times daily.    Marland Kitchen aspirin 81 MG EC tablet Take by mouth.    . Cholecalciferol 125 MCG (5000 UT) capsule Take by mouth.    . cyanocobalamin 1000 MCG tablet Take by mouth.    . famotidine (PEPCID) 40 MG tablet Take 1 tablet (40 mg total) by mouth 2 (two) times daily. 180 tablet 2  . levothyroxine (SYNTHROID) 150 MCG tablet TAKE 1 TABLET BY MOUTH ONCE DAILY 90 tablet 3  . metolazone (ZAROXOLYN) 5 MG tablet     . metoprolol tartrate (LOPRESSOR) 25 MG tablet Take 25 mg by mouth 2 (two) times daily.    . montelukast (SINGULAIR) 10 MG tablet TAKE 1 TABLET BY MOUTH ONCE DAILY IN THE EVENING 90 tablet 3  . morphine (MS CONTIN) 60 MG 12 hr tablet SMARTSIG:1 Tablet(s) By Mouth Every 12 Hours    . Multiple Vitamin (MULTI-VITAMIN) tablet Take by mouth.    . Oxycodone HCl 20 MG TABS     . potassium chloride SA (KLOR-CON) 20 MEQ tablet TAKE 1 TABLET BY MOUTH  TWICE DAILY WITH FOOD 180 tablet 3  . pravastatin (PRAVACHOL) 40 MG tablet TAKE 1 TABLET BY MOUTH  TWICE DAILY 180 tablet 2  . tiZANidine (ZANAFLEX) 4 MG tablet Take 1 tablet (4 mg total) by mouth 3 (three) times daily. 405 tablet 1   No current facility-administered medications on file  prior to visit.   Past Medical History:  Diagnosis Date  . Hyperglycemia   . Hypothyroidism   . 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 Mixed hyperlipidemia   . Morbid obesity due to excess calories ([place])   . Scoliosis   . Urge incontinence    Past Surgical History:  Procedure Laterality Date  . GASTRIC BYPASS N/A   . HEMORRHOID SURGERY    . HERNIA REPAIR    . RHINOPLASTY      Family History  Problem Relation Age of Onset  . COPD Mother   . Lung cancer Father   . Diabetes Brother    Social History   Socioeconomic History  . Marital status: Single    Spouse name: Not on file  . Number of children: Not on file  . Years of education: Not on file  . Highest education level: Not on file  Occupational History  . Not on file  Tobacco Use  . Smoking status: Former Smoker    Types: Cigarettes    Quit date: 1984    Years since quitting: 37.9  . Smokeless tobacco: Never Used  Vaping Use  . Vaping Use: Never used  Substance and Sexual Activity  . Alcohol use: Not Currently  . Drug  use: Never  . Sexual activity: Not on file  Other Topics Concern  . Not on file  Social History Narrative  . Not on file   Social Determinants of Health   Financial Resource Strain:   . Difficulty of Paying Living Expenses: Not on file  Food Insecurity:   . Worried About Charity fundraiser in the Last Year: Not on file  . Ran Out of Food in the Last Year: Not on file  Transportation Needs:   . Lack of Transportation (Medical): Not on file  . Lack of Transportation (Non-Medical): Not on file  Physical Activity:   . Days of Exercise per Week: Not on file  . Minutes of Exercise per Session: Not on file  Stress:   . Feeling of Stress : Not on file  Social Connections:   . Frequency of Communication with Friends and Family: Not on file  . Frequency of Social Gatherings with Friends and Family: Not on file  . Attends Religious Services: Not on file  . Active Member of Clubs or  Organizations: Not on file  . Attends Archivist Meetings: Not on file  . Marital Status: Not on file    Review of Systems  Constitutional: Positive for activity change. Negative for appetite change.  HENT: Negative for congestion and dental problem.   Eyes: Negative.   Respiratory: Positive for cough. Negative for choking and chest tightness.   Cardiovascular: Negative for chest pain, palpitations and leg swelling.  Gastrointestinal: Positive for abdominal pain. Negative for abdominal distention.  Endocrine: Negative.   Genitourinary: Negative.   Musculoskeletal: Positive for arthralgias.  Skin:       Right leg edema with weeping legs and now some lung congestion  Hematological: Negative.   Psychiatric/Behavioral: Negative.      Objective:  BP 140/70   Pulse 68   Temp (!) 97.4 F (36.3 C)   Resp 18   Ht 5\' 11"  (1.803 m)   Wt 296 lb 6.4 oz (134.4 kg)   SpO2 94%   BMI 41.34 kg/m   BP/Weight 10/15/2020 07/31/2020 07/30/5175  Systolic BP 160 737 106  Diastolic BP 70 82 60  Wt. (Lbs) 296.4 305 308  BMI 41.34 41.37 41.77    Physical Exam Vitals reviewed.  Constitutional:      Appearance: Normal appearance.  Cardiovascular:     Rate and Rhythm: Normal rate and regular rhythm.     Heart sounds: Normal heart sounds.  Pulmonary:     Effort: Pulmonary effort is normal.     Breath sounds: Rhonchi (at bases lungs) present.  Skin:    Comments: Right leg is red and swollen with weeping serosanguanous fluid  Neurological:     Mental Status: He is alert.       Lab Results  Component Value Date   WBC 5.5 07/31/2020   HGB 12.0 (L) 07/31/2020   HCT 37.9 07/31/2020   PLT 195 07/31/2020   GLUCOSE 87 07/31/2020   CHOL 93 (L) 07/31/2020   TRIG 41 07/31/2020   HDL 44 07/31/2020   LDLCALC 38 07/31/2020   ALT 20 07/31/2020   AST 21 07/31/2020   NA 145 (H) 07/31/2020   K 4.3 07/31/2020   CL 110 (H) 07/31/2020   CREATININE 0.85 07/31/2020   BUN 19 07/31/2020    CO2 22 07/31/2020   TSH 9.320 (H) 07/31/2020   HGBA1C 5.2 04/01/2020      Assessment & Plan:   1. Venous stasis dermatitis  of both lower extremities Increased torsemide 40mg  in am and 20 at night. Put unna boot bilaterally.  Follow up one week       I spent 20 minutes dedicated to the care of this patient on the date of this encounter to include face-to-face time with the patient, as well as: Preparing to see the patient (review of tests). Obtaining and/or reviewing separately obtained history. Performing a medically appropriate examination and/or evaluation. Counseling and educating the patient/family/caregiver. Ordering medications, tests, or procedures. Documenting clinical information in the electronic or other health record. Independently interpreting results (not separately reported) and communicating results to the patient/family/caregiver.  My nursing staff have aided in the documentation of this note on the behalf of [name], MD,as directed by  [name], MD and thoroughly reviewed by [name], MD.  Follow-up: Return in about 1 week (around 10/22/2020) for edema legs.  An After Visit Summary was printed and given to the patient.  [name], MD [facility] [phone]

## 2020-10-21 ENCOUNTER — Encounter: Payer: Self-pay | Admitting: Legal Medicine

## 2020-10-21 ENCOUNTER — Other Ambulatory Visit: Payer: Self-pay

## 2020-10-21 ENCOUNTER — Ambulatory Visit (INDEPENDENT_AMBULATORY_CARE_PROVIDER_SITE_OTHER): Payer: Medicare Other | Admitting: Legal Medicine

## 2020-10-21 VITALS — BP 110/70 | HR 79 | Temp 97.1°F | Resp 16 | Ht 71.0 in | Wt 296.6 lb

## 2020-10-21 DIAGNOSIS — I872 Venous insufficiency (chronic) (peripheral): Secondary | ICD-10-CM | POA: Diagnosis not present

## 2020-10-21 NOTE — Progress Notes (Signed)
Subjective:  Patient ID: Antonio Evans, male    DOB: 01-21-1951  Age: 69 y.o. MRN: [id number]  Chief Complaint  Patient presents with  . Leg Pain    Bilateral leg pain and stinging     HPI: patients, legs are swelling more now that cardiology changed furosemide to torsemide 20mg  one twice.started 09/22/2020.  He is now having increased edema and weeping on legs.He is having some rhonchi in lungs, he is having orthopnea and PND Patient has chronic venous stasis in both lower legs which has been chronically problem he is also morbidly obese.  The leg is legs are much more swollen than before and draining clear serosanguineous fluid.  Patient follows up on 10/21/2020.  The Unna boot was removed and both legs showed decrease edema and better circulation.  He has 2 small 1 sonometer ulcers at the top of the right leg which are not infected and is doing well.  Is very satisfied with his care and does not want little boots reapplied. Current Outpatient Medications on File Prior to Visit  Medication Sig Dispense Refill  . apixaban (ELIQUIS) 5 MG TABS tablet Take 5 mg by mouth 2 (two) times daily.    Marland Kitchen aspirin 81 MG EC tablet Take by mouth.    . Cholecalciferol 125 MCG (5000 UT) capsule Take by mouth.    . cyanocobalamin 1000 MCG tablet Take by mouth.    . diclofenac Sodium (VOLTAREN) 1 % GEL SMARTSIG:Gram(s) Topical Twice Daily    . famotidine (PEPCID) 40 MG tablet Take 1 tablet (40 mg total) by mouth 2 (two) times daily. 180 tablet 2  . levothyroxine (SYNTHROID) 150 MCG tablet TAKE 1 TABLET BY MOUTH ONCE DAILY 90 tablet 3  . metolazone (ZAROXOLYN) 5 MG tablet     . metoprolol tartrate (LOPRESSOR) 25 MG tablet Take 25 mg by mouth 2 (two) times daily.    . montelukast (SINGULAIR) 10 MG tablet TAKE 1 TABLET BY MOUTH ONCE DAILY IN THE EVENING 90 tablet 3  . morphine (AVINZA) 60 MG 24 hr capsule Take by mouth.    . morphine (MS CONTIN) 60 MG 12 hr tablet SMARTSIG:1 Tablet(s) By Mouth Every 12 Hours     . Multiple Vitamin (MULTI-VITAMIN) tablet Take by mouth.    . Oxycodone HCl 20 MG TABS     . potassium chloride SA (KLOR-CON) 20 MEQ tablet TAKE 1 TABLET BY MOUTH  TWICE DAILY WITH FOOD 180 tablet 3  . pravastatin (PRAVACHOL) 40 MG tablet TAKE 1 TABLET BY MOUTH  TWICE DAILY 180 tablet 2  . simvastatin (ZOCOR) 40 MG tablet Take by mouth.    Marland Kitchen tiZANidine (ZANAFLEX) 4 MG tablet Take 1 tablet (4 mg total) by mouth 3 (three) times daily. 405 tablet 1  . torsemide (DEMADEX) 20 MG tablet Take by mouth.     No current facility-administered medications on file prior to visit.   Past Medical History:  Diagnosis Date  . Hyperglycemia   . Hypothyroidism   . 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 Mixed hyperlipidemia   . Morbid obesity due to excess calories ([place])   . Scoliosis   . Urge incontinence    Past Surgical History:  Procedure Laterality Date  . GASTRIC BYPASS N/A   . HEMORRHOID SURGERY    . HERNIA REPAIR    . RHINOPLASTY      Family History  Problem Relation Age of Onset  . COPD Mother   . Lung cancer Father   .  Diabetes Brother    Social History   Socioeconomic History  . Marital status: Single    Spouse name: Not on file  . Number of children: Not on file  . Years of education: Not on file  . Highest education level: Not on file  Occupational History  . Not on file  Tobacco Use  . Smoking status: Former Smoker    Types: Cigarettes    Quit date: 1984    Years since quitting: 37.9  . Smokeless tobacco: Never Used  Vaping Use  . Vaping Use: Never used  Substance and Sexual Activity  . Alcohol use: Not Currently  . Drug use: Never  . Sexual activity: Not on file  Other Topics Concern  . Not on file  Social History Narrative  . Not on file   Social Determinants of Health   Financial Resource Strain:   . Difficulty of Paying Living Expenses: Not on file  Food Insecurity:   . Worried About Charity fundraiser in the Last Year: Not on file  . Ran  Out of Food in the Last Year: Not on file  Transportation Needs:   . Lack of Transportation (Medical): Not on file  . Lack of Transportation (Non-Medical): Not on file  Physical Activity:   . Days of Exercise per Week: Not on file  . Minutes of Exercise per Session: Not on file  Stress:   . Feeling of Stress : Not on file  Social Connections:   . Frequency of Communication with Friends and Family: Not on file  . Frequency of Social Gatherings with Friends and Family: Not on file  . Attends Religious Services: Not on file  . Active Member of Clubs or Organizations: Not on file  . Attends Archivist Meetings: Not on file  . Marital Status: Not on file    Review of Systems  Constitutional: Positive for activity change. Negative for appetite change.  HENT: Negative for congestion and dental problem.   Eyes: Negative.   Respiratory: Positive for cough. Negative for choking and chest tightness.   Cardiovascular: Negative for chest pain, palpitations and leg swelling.  Gastrointestinal: Positive for abdominal pain. Negative for abdominal distention.  Endocrine: Negative.   Genitourinary: Negative.   Musculoskeletal: Positive for arthralgias.  Skin:       The Unna boots were removed and both legs show much less edema no pitting in each leg.  He has good pulses and he has 2 small about 1 cm very shallow ulcers on the upper right leg.  These will be dressed.  Hematological: Negative.   Psychiatric/Behavioral: Negative.      Objective:  BP 110/70 (BP Location: Right Arm, Patient Position: Sitting, Cuff Size: Normal)   Pulse 79   Temp (!) 97.1 F (36.2 C) (Temporal)   Resp 16   Ht 5\' 11"  (1.803 m)   Wt 296 lb 9.6 oz (134.5 kg)   SpO2 97%   BMI 41.37 kg/m   BP/Weight 10/21/2020 34/19/6222 08/06/9891  Systolic BP 119 417 408  Diastolic BP 70 70 82  Wt. (Lbs) 296.6 296.4 305  BMI 41.37 41.34 41.37    Physical Exam Vitals reviewed.  Constitutional:      Appearance:  Normal appearance.  Cardiovascular:     Rate and Rhythm: Normal rate and regular rhythm.     Heart sounds: Normal heart sounds.  Pulmonary:     Effort: Pulmonary effort is normal.     Breath sounds: Rhonchi (at bases  lungs) present.  Skin:    Comments: Unna boots were removed and is much less edema of both legs there is just a small 1 cm ulcer #2 in the right upper leg which will be dressed simply.  We will stop compression boots at the present time and try to have him use his compression socks.  Neurological:     Mental Status: He is alert.       Lab Results  Component Value Date   WBC 5.5 07/31/2020   HGB 12.0 (L) 07/31/2020   HCT 37.9 07/31/2020   PLT 195 07/31/2020   GLUCOSE 87 07/31/2020   CHOL 93 (L) 07/31/2020   TRIG 41 07/31/2020   HDL 44 07/31/2020   LDLCALC 38 07/31/2020   ALT 20 07/31/2020   AST 21 07/31/2020   NA 145 (H) 07/31/2020   K 4.3 07/31/2020   CL 110 (H) 07/31/2020   CREATININE 0.85 07/31/2020   BUN 19 07/31/2020   CO2 22 07/31/2020   TSH 9.320 (H) 07/31/2020   HGBA1C 5.2 04/01/2020      Assessment & Plan:   1. Venous stasis dermatitis of both lower extremities Increased torsemide 40mg  in am and 20 at night. Remove unna boots, dress small ulcers.  Follow up 2 weeks  Follow-up: Return in about 2 weeks (around 11/04/2020) for for legs.  An After Visit Summary was printed and given to the patient.  [name], MD [facility] [phone]

## 2020-10-27 DIAGNOSIS — M4712 Other spondylosis with myelopathy, cervical region: Secondary | ICD-10-CM | POA: Diagnosis not present

## 2020-10-27 DIAGNOSIS — G890 Central pain syndrome: Secondary | ICD-10-CM | POA: Diagnosis not present

## 2020-10-27 DIAGNOSIS — Z79891 Long term (current) use of opiate analgesic: Secondary | ICD-10-CM | POA: Diagnosis not present

## 2020-10-27 DIAGNOSIS — M19079 Primary osteoarthritis, unspecified ankle and foot: Secondary | ICD-10-CM | POA: Diagnosis not present

## 2020-10-28 ENCOUNTER — Ambulatory Visit: Payer: Medicare Other

## 2020-10-28 DIAGNOSIS — I4819 Other persistent atrial fibrillation: Secondary | ICD-10-CM | POA: Diagnosis not present

## 2020-10-28 DIAGNOSIS — Z7901 Long term (current) use of anticoagulants: Secondary | ICD-10-CM | POA: Diagnosis not present

## 2020-10-28 DIAGNOSIS — E785 Hyperlipidemia, unspecified: Secondary | ICD-10-CM | POA: Diagnosis not present

## 2020-10-28 DIAGNOSIS — R079 Chest pain, unspecified: Secondary | ICD-10-CM | POA: Diagnosis not present

## 2020-11-04 ENCOUNTER — Encounter: Payer: Self-pay | Admitting: Legal Medicine

## 2020-11-04 ENCOUNTER — Other Ambulatory Visit: Payer: Self-pay

## 2020-11-04 ENCOUNTER — Ambulatory Visit (INDEPENDENT_AMBULATORY_CARE_PROVIDER_SITE_OTHER): Payer: Medicare Other | Admitting: Legal Medicine

## 2020-11-04 VITALS — BP 116/62 | HR 65 | Temp 97.8°F | Resp 16 | Ht 71.0 in | Wt 300.0 lb

## 2020-11-04 DIAGNOSIS — E038 Other specified hypothyroidism: Secondary | ICD-10-CM

## 2020-11-04 DIAGNOSIS — Z6841 Body Mass Index (BMI) 40.0 and over, adult: Secondary | ICD-10-CM

## 2020-11-04 DIAGNOSIS — I872 Venous insufficiency (chronic) (peripheral): Secondary | ICD-10-CM

## 2020-11-04 MED ORDER — LEVOTHYROXINE SODIUM 150 MCG PO TABS: 150.0000 ug | ORAL_TABLET | Freq: Every day | ORAL | 3 refills | Status: DC

## 2020-11-04 MED ORDER — POTASSIUM CHLORIDE CRYS ER 20 MEQ PO TBCR: 20.0000 meq | EXTENDED_RELEASE_TABLET | Freq: Two times a day (BID) | ORAL | 3 refills | Status: DC

## 2020-11-04 MED ORDER — EUCERIN EX CREA: TOPICAL_CREAM | CUTANEOUS | 6 refills | Status: DC | PRN

## 2020-11-04 MED ORDER — HYDROXYZINE HCL 25 MG PO TABS: 25.0000 mg | ORAL_TABLET | Freq: Three times a day (TID) | ORAL | 4 refills | Status: DC | PRN

## 2020-11-04 NOTE — Progress Notes (Signed)
Subjective:  Patient ID: Antonio Evans, male    DOB: 10/08/1951  Age: 69 y.o. MRN: [id number]  Chief Complaint  Patient presents with  . Pain    Patient states both legs and feel are painful and itch     HPI: patient following up for CHF and edema both legs. He has varicosites both legs. Cardiologist says he hs low normal EF on 2D echo.  The legs are having no breakdown.  There is some scaling. He denies any Dyspnea  Atopic dermatitis- dry skin and use moisturizer. Short showers. He is having pruritis for last few days.   Current Outpatient Medications on File Prior to Visit  Medication Sig Dispense Refill  . apixaban (ELIQUIS) 5 MG TABS tablet Take 5 mg by mouth 2 (two) times daily.    Marland Kitchen aspirin 81 MG EC tablet Take by mouth.    . Cholecalciferol 125 MCG (5000 UT) capsule Take by mouth.    . cyanocobalamin 1000 MCG tablet Take by mouth.    . diclofenac Sodium (VOLTAREN) 1 % GEL SMARTSIG:Gram(s) Topical Twice Daily    . famotidine (PEPCID) 40 MG tablet Take 1 tablet (40 mg total) by mouth 2 (two) times daily. 180 tablet 2  . levothyroxine (SYNTHROID) 150 MCG tablet TAKE 1 TABLET BY MOUTH ONCE DAILY 90 tablet 3  . metolazone (ZAROXOLYN) 5 MG tablet     . metoprolol tartrate (LOPRESSOR) 25 MG tablet Take 25 mg by mouth 2 (two) times daily.    . montelukast (SINGULAIR) 10 MG tablet TAKE 1 TABLET BY MOUTH ONCE DAILY IN THE EVENING 90 tablet 3  . morphine (AVINZA) 60 MG 24 hr capsule Take by mouth.    . morphine (MS CONTIN) 60 MG 12 hr tablet SMARTSIG:1 Tablet(s) By Mouth Every 12 Hours    . Multiple Vitamin (MULTI-VITAMIN) tablet Take by mouth.    . Oxycodone HCl 20 MG TABS     . potassium chloride SA (KLOR-CON) 20 MEQ tablet TAKE 1 TABLET BY MOUTH  TWICE DAILY WITH FOOD 180 tablet 3  . pravastatin (PRAVACHOL) 40 MG tablet TAKE 1 TABLET BY MOUTH  TWICE DAILY 180 tablet 2  . simvastatin (ZOCOR) 40 MG tablet Take by mouth.    Marland Kitchen tiZANidine (ZANAFLEX) 4 MG tablet Take 1 tablet (4 mg total)  by mouth 3 (three) times daily. 405 tablet 1  . torsemide (DEMADEX) 20 MG tablet Take by mouth.     No current facility-administered medications on file prior to visit.   Past Medical History:  Diagnosis Date  . Hyperglycemia   . Hypothyroidism   . 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 Mixed hyperlipidemia   . Morbid obesity due to excess calories ([place])   . Scoliosis   . Urge incontinence    Past Surgical History:  Procedure Laterality Date  . GASTRIC BYPASS N/A   . HEMORRHOID SURGERY    . HERNIA REPAIR    . RHINOPLASTY      Family History  Problem Relation Age of Onset  . COPD Mother   . Lung cancer Father   . Diabetes Brother    Social History   Socioeconomic History  . Marital status: Single    Spouse name: Not on file  . Number of children: Not on file  . Years of education: Not on file  . Highest education level: Not on file  Occupational History  . Not on file  Tobacco Use  . Smoking status: Former Smoker  Types: Cigarettes    Quit date: 27    Years since quitting: 37.9  . Smokeless tobacco: Never Used  Vaping Use  . Vaping Use: Never used  Substance and Sexual Activity  . Alcohol use: Not Currently  . Drug use: Never  . Sexual activity: Not on file  Other Topics Concern  . Not on file  Social History Narrative  . Not on file   Social Determinants of Health   Financial Resource Strain:   . Difficulty of Paying Living Expenses: Not on file  Food Insecurity:   . Worried About Charity fundraiser in the Last Year: Not on file  . Ran Out of Food in the Last Year: Not on file  Transportation Needs:   . Lack of Transportation (Medical): Not on file  . Lack of Transportation (Non-Medical): Not on file  Physical Activity:   . Days of Exercise per Week: Not on file  . Minutes of Exercise per Session: Not on file  Stress:   . Feeling of Stress : Not on file  Social Connections:   . Frequency of Communication with Friends and Family: Not  on file  . Frequency of Social Gatherings with Friends and Family: Not on file  . Attends Religious Services: Not on file  . Active Member of Clubs or Organizations: Not on file  . Attends Archivist Meetings: Not on file  . Marital Status: Not on file    Review of Systems  Constitutional: Negative for activity change, appetite change and fatigue.  HENT: Positive for sore throat. Negative for sinus pain.   Eyes: Negative for visual disturbance.  Respiratory: Negative for apnea, chest tightness, shortness of breath and wheezing.   Cardiovascular: Negative for chest pain, palpitations and leg swelling.  Gastrointestinal: Negative for abdominal distention and abdominal pain.  Genitourinary: Positive for dysuria and enuresis. Negative for difficulty urinating.  Musculoskeletal: Positive for neck pain. Negative for arthralgias and back pain.  Skin: Positive for rash.       Chronic stasis edema LE  Neurological: Negative for dizziness, syncope and facial asymmetry.  Psychiatric/Behavioral: Positive for confusion. Negative for agitation.     Objective:  BP 116/62 (BP Location: Right Arm, Patient Position: Sitting, Cuff Size: Normal)   Pulse 65   Temp 97.8 F (36.6 C) (Temporal)   Resp 16   Ht 5\' 11"  (1.803 m)   Wt 300 lb (136.1 kg)   SpO2 95%   BMI 41.84 kg/m   BP/Weight 11/04/2020 10/21/2020 70/62/3762  Systolic BP 831 517 616  Diastolic BP 62 70 70  Wt. (Lbs) 300 296.6 296.4  BMI 41.84 41.37 41.34    Physical Exam Vitals reviewed.  Constitutional:      Appearance: Normal appearance.  Cardiovascular:     Rate and Rhythm: Normal rate and regular rhythm.  Musculoskeletal:     Cervical back: Normal range of motion and neck supple.  Neurological:     Mental Status: He is alert.      Lab Results  Component Value Date   WBC 5.5 07/31/2020   HGB 12.0 (L) 07/31/2020   HCT 37.9 07/31/2020   PLT 195 07/31/2020   GLUCOSE 87 07/31/2020   CHOL 93 (L) 07/31/2020     TRIG 41 07/31/2020   HDL 44 07/31/2020   LDLCALC 38 07/31/2020   ALT 20 07/31/2020   AST 21 07/31/2020   NA 145 (H) 07/31/2020   K 4.3 07/31/2020   CL 110 (H) 07/31/2020  CREATININE 0.85 07/31/2020   BUN 19 07/31/2020   CO2 22 07/31/2020   TSH 9.320 (H) 07/31/2020   HGBA1C 5.2 04/01/2020      Assessment & Plan:   Diagnoses and all orders for this visit: Venous stasis dermatitis of both lower extremities -     Skin Protectants, Misc. (EUCERIN) cream; Apply topically as needed for dry skin. -     hydrOXYzine (ATARAX/VISTARIL) 25 MG tablet; Take 1 tablet (25 mg total) by mouth 3 (three) times daily as needed. Patient has chronic edema of legs that is stable on mediicnes.  No skin breakdown.  BMI 40.0-44.9, adult [facility]) An individualize plan was formulated for obesity using patient history and physical exam to encourage weight loss.  An evidence based program was formulated.  Patient is to cut portion size with meals and to plan physical exercise 3 days a week at least 20 minutes.  Weight watchers and other programs are helpful.  Planned amount of weight loss 10 lbs.  Other specified hypothyroidism -     levothyroxine (SYNTHROID) 150 MCG tablet; Take 1 tablet (150 mcg total) by mouth daily. Patient is known to have hypothyroidism and is n treatment with levothyroxine 15mcg.  Patient was diagnosed 10 years ago.  Other treatment includes none.  Patient is compliant with medicines and last TSH 6 months ago.  Last TSH was normal. Other orders -     potassium chloride SA (KLOR-CON) 20 MEQ tablet; Take 1 tablet (20 mEq total) by mouth 2 (two) times daily with a meal.          I spent 25 minutes dedicated to the care of this patient on the date of this encounter to include face-to-face time with the patient,   Follow-up: No follow-ups on file.  An After Visit Summary was printed and given to the patient.  [name], MD [facility] [phone]

## 2020-11-10 ENCOUNTER — Telehealth: Payer: Self-pay

## 2020-11-10 ENCOUNTER — Other Ambulatory Visit: Payer: Self-pay

## 2020-11-10 DIAGNOSIS — I872 Venous insufficiency (chronic) (peripheral): Secondary | ICD-10-CM

## 2020-11-10 MED ORDER — EUCERIN EX CREA: TOPICAL_CREAM | CUTANEOUS | 6 refills | Status: AC | PRN

## 2020-11-10 MED ORDER — HYDROXYZINE HCL 25 MG PO TABS: 25.0000 mg | ORAL_TABLET | Freq: Three times a day (TID) | ORAL | 4 refills | Status: DC | PRN

## 2020-11-11 ENCOUNTER — Other Ambulatory Visit: Payer: Self-pay

## 2020-11-11 DIAGNOSIS — T7840XD Allergy, unspecified, subsequent encounter: Secondary | ICD-10-CM

## 2020-11-11 DIAGNOSIS — E038 Other specified hypothyroidism: Secondary | ICD-10-CM

## 2020-11-11 MED ORDER — MONTELUKAST SODIUM 10 MG PO TABS: 10.0000 mg | ORAL_TABLET | Freq: Every evening | ORAL | 2 refills | Status: DC

## 2020-11-11 MED ORDER — LEVOTHYROXINE SODIUM 150 MCG PO TABS: 150.0000 ug | ORAL_TABLET | Freq: Every day | ORAL | 2 refills | Status: DC

## 2020-11-11 MED ORDER — POTASSIUM CHLORIDE CRYS ER 20 MEQ PO TBCR
20.0000 meq | EXTENDED_RELEASE_TABLET | Freq: Two times a day (BID) | ORAL | 2 refills | Status: DC
Start: 2020-11-11 — End: 2021-04-01

## 2020-12-01 DIAGNOSIS — M19079 Primary osteoarthritis, unspecified ankle and foot: Secondary | ICD-10-CM | POA: Diagnosis not present

## 2020-12-01 DIAGNOSIS — Z79891 Long term (current) use of opiate analgesic: Secondary | ICD-10-CM | POA: Diagnosis not present

## 2020-12-01 DIAGNOSIS — G890 Central pain syndrome: Secondary | ICD-10-CM | POA: Diagnosis not present

## 2020-12-04 ENCOUNTER — Encounter: Payer: Self-pay | Admitting: Legal Medicine

## 2020-12-04 ENCOUNTER — Ambulatory Visit (INDEPENDENT_AMBULATORY_CARE_PROVIDER_SITE_OTHER): Payer: Medicare HMO | Admitting: Legal Medicine

## 2020-12-04 ENCOUNTER — Other Ambulatory Visit: Payer: Self-pay

## 2020-12-04 DIAGNOSIS — E039 Hypothyroidism, unspecified: Secondary | ICD-10-CM

## 2020-12-04 DIAGNOSIS — R739 Hyperglycemia, unspecified: Secondary | ICD-10-CM | POA: Diagnosis not present

## 2020-12-04 DIAGNOSIS — I4819 Other persistent atrial fibrillation: Secondary | ICD-10-CM | POA: Diagnosis not present

## 2020-12-04 DIAGNOSIS — Z79891 Long term (current) use of opiate analgesic: Secondary | ICD-10-CM | POA: Diagnosis not present

## 2020-12-04 DIAGNOSIS — E782 Mixed hyperlipidemia: Secondary | ICD-10-CM | POA: Diagnosis not present

## 2020-12-04 DIAGNOSIS — M25462 Effusion, left knee: Secondary | ICD-10-CM | POA: Diagnosis not present

## 2020-12-04 DIAGNOSIS — Z6841 Body Mass Index (BMI) 40.0 and over, adult: Secondary | ICD-10-CM | POA: Diagnosis not present

## 2020-12-04 NOTE — Progress Notes (Signed)
Subjective:  Patient ID: Antonio Evans, male    DOB: 08/06/1951  Age: 70 y.o. MRN: [id number]  Chief Complaint  Patient presents with   Hyperlipidemia   Hypothyroidism    HPI: chronic visit  Patient presents with hyperlipidemia.  Compliance with treatment has been good; patient takes medicines as directed, maintains low cholesterol diet, follows up as directed, and maintains exercise regimen.  Patient is using pravastatin without problems.    Patient has HYPOTHYROIDISM.  Diagnosed 10 years ago.  Patient has stable thyroid readings.  Patient is having normal.  Last TSH was normal.  continue dosage of thyroid medicine.  Chronic pain Sr. Serring prescribes medicines, his pain is worse. He is on morphine and oxycodone  Patient has a diagnosis of permanent atrial fibrillation.   Patient is on eliquis and has controlled ventricular response.  Patient is CV stable  Left knee pain, swollen and red with effusion, chronic OA. Current Outpatient Medications on File Prior to Visit  Medication Sig Dispense Refill   apixaban (ELIQUIS) 5 MG TABS tablet Take 5 mg by mouth 2 (two) times daily.     aspirin 81 MG EC tablet Take by mouth.     Cholecalciferol 125 MCG (5000 UT) capsule Take by mouth.     cyanocobalamin 1000 MCG tablet Take by mouth.     diclofenac Sodium (VOLTAREN) 1 % GEL SMARTSIG:Gram(s) Topical Twice Daily     famotidine (PEPCID) 40 MG tablet Take 1 tablet (40 mg total) by mouth 2 (two) times daily. 180 tablet 2   hydrOXYzine (ATARAX/VISTARIL) 25 MG tablet Take 1 tablet (25 mg total) by mouth 3 (three) times daily as needed. 90 tablet 4   hydrOXYzine (VISTARIL) 25 MG capsule Take 25 mg by mouth 3 (three) times daily.     levothyroxine (SYNTHROID) 150 MCG tablet Take 1 tablet (150 mcg total) by mouth daily. 90 tablet 2   metolazone (ZAROXOLYN) 5 MG tablet      metoprolol tartrate (LOPRESSOR) 25 MG tablet Take 25 mg by mouth 2 (two) times daily.     montelukast  (SINGULAIR) 10 MG tablet Take 1 tablet (10 mg total) by mouth every evening. 90 tablet 2   morphine (AVINZA) 60 MG 24 hr capsule Take by mouth.     morphine (MS CONTIN) 60 MG 12 hr tablet SMARTSIG:1 Tablet(s) By Mouth Every 12 Hours     Multiple Vitamin (MULTI-VITAMIN) tablet Take by mouth.     Oxycodone HCl 20 MG TABS      potassium chloride SA (KLOR-CON) 20 MEQ tablet Take 1 tablet (20 mEq total) by mouth 2 (two) times daily with a meal. 180 tablet 2   pravastatin (PRAVACHOL) 40 MG tablet TAKE 1 TABLET BY MOUTH  TWICE DAILY 180 tablet 2   Skin Protectants, Misc. (EUCERIN) cream Apply topically as needed for dry skin. 454 g 6   tiZANidine (ZANAFLEX) 4 MG tablet Take 1 tablet (4 mg total) by mouth 3 (three) times daily. 405 tablet 1   torsemide (DEMADEX) 20 MG tablet Take by mouth.     No current facility-administered medications on file prior to visit.   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    Mixed hyperlipidemia    Morbid obesity due to excess calories ([facility])    Scoliosis    Urge incontinence    Past Surgical History:  Procedure Laterality Date   GASTRIC BYPASS N/A  HEMORRHOID SURGERY     HERNIA REPAIR     RHINOPLASTY      Family History  Problem Relation Age of Onset   COPD Mother    Lung cancer Father    Diabetes Brother    Social History   Socioeconomic History   Marital status: Single    Spouse name: Not on file   Number of children: Not on file   Years of education: Not on file   Highest education level: Not on file  Occupational History   Not on file  Tobacco Use   Smoking status: Former Smoker    Types: Cigarettes    Quit date: 1984    Years since quitting: 38.0   Smokeless tobacco: Never Used  Building services engineer Use: Never used  Substance and Sexual Activity   Alcohol use: Not Currently   Drug use: Never   Sexual activity: Not on  file  Other Topics Concern   Not on file  Social History Narrative   Not on file   Social Determinants of Health   Financial Resource Strain: Not on file  Food Insecurity: Not on file  Transportation Needs: Not on file  Physical Activity: Not on file  Stress: Not on file  Social Connections: Not on file    Review of Systems  Constitutional: Negative for activity change, appetite change, fatigue and fever.  HENT: Negative for congestion, sinus pain and sore throat.   Eyes: Negative for visual disturbance.  Respiratory: Negative for cough and choking.   Cardiovascular: Negative for chest pain, palpitations and leg swelling.  Gastrointestinal: Negative for abdominal distention and abdominal pain.  Endocrine: Negative for polyuria.  Genitourinary: Negative for difficulty urinating, dysuria and urgency.  Musculoskeletal: Negative for arthralgias and back pain.  Neurological: Negative.   Psychiatric/Behavioral: Negative for agitation and confusion.     Objective:  BP 110/70    Pulse 77    Temp 98.3 F (36.8 C)    Resp 17    Ht 5\' 11"  (1.803 m)    Wt 290 lb (131.5 kg)    SpO2 96%    BMI 40.45 kg/m   BP/Weight 12/04/2020 11/04/2020 10/21/2020  Systolic BP 110 116 110  Diastolic BP 70 62 70  Wt. (Lbs) 290 300 296.6  BMI 40.45 41.84 41.37    Physical Exam Vitals reviewed.  Constitutional:      Appearance: Normal appearance.  HENT:     Head: Normocephalic.     Right Ear: Tympanic membrane, ear canal and external ear normal.     Left Ear: Tympanic membrane, ear canal and external ear normal.     Mouth/Throat:     Mouth: Mucous membranes are moist.     Pharynx: Oropharynx is clear.  Eyes:     Extraocular Movements: Extraocular movements intact.     Conjunctiva/sclera: Conjunctivae normal.     Pupils: Pupils are equal, round, and reactive to light.  Cardiovascular:     Rate and Rhythm: Normal rate. Rhythm irregular.     Pulses: Normal pulses.     Heart sounds: Normal  heart sounds. No murmur heard. No gallop.   Pulmonary:     Effort: Pulmonary effort is normal.     Breath sounds: Normal breath sounds.  Abdominal:     General: Abdomen is flat. Bowel sounds are normal. There is no distension.     Palpations: Abdomen is soft.     Tenderness: There is no abdominal tenderness.  Musculoskeletal:  General: No swelling or deformity.     Cervical back: Normal range of motion and neck supple.     Left knee: Swelling present. Decreased range of motion. Tenderness present. No medial joint line, lateral joint line, MCL, LCL, ACL or PCL tenderness. No LCL laxity, MCL laxity, ACL laxity or PCL laxity.Normal pulse.     Instability Tests: Anterior drawer test negative. Posterior drawer test negative. Anterior Lachman test negative. Medial McMurray test negative and lateral McMurray test negative.       Legs:  Skin:    General: Skin is warm and dry.  Neurological:     Mental Status: He is alert.     Diabetic Foot Exam - Simple   Simple Foot Form Diabetic Foot exam was performed with the following findings: Yes 12/04/2020  3:23 PM  Visual Inspection No deformities, no ulcerations, no other skin breakdown bilaterally: Yes Sensation Testing See comments: Yes Pulse Check See comments: Yes Comments Poor pulses in feet, no sensation in feet.      Lab Results  Component Value Date   WBC 5.7 12/04/2020   HGB 12.7 (L) 12/04/2020   HCT 39.1 12/04/2020   PLT 248 12/04/2020   GLUCOSE 98 12/04/2020   CHOL 92 (L) 12/04/2020   TRIG 62 12/04/2020   HDL 35 (L) 12/04/2020   LDLCALC 43 12/04/2020   ALT 23 12/04/2020   AST 24 12/04/2020   NA 142 12/04/2020   K 4.2 12/04/2020   CL 105 12/04/2020   CREATININE 0.93 12/04/2020   BUN 24 12/04/2020   CO2 25 12/04/2020   TSH 9.320 (H) 07/31/2020   HGBA1C 5.6 12/04/2020      Assessment & Plan:  Diagnoses and all orders for this visit: Morbid obesity due to excess calories [facility]) An individualize plan was  formulated for obesity using patient history and physical exam to encourage weight loss.  An evidence based program was formulated.  Patient is to cut portion size with meals and to plan physical exercise 3 days a week at least 20 minutes.  Weight watchers and other programs are helpful.  Planned amount of weight loss 10 lbs.  Persistent atrial fibrillation (HCC) -     CBC with Differential/Platelet -     Comprehensive metabolic panel Patient has a diagnosis of permanent atrial fibrillation.   Patient is on eliquis and has controlled ventricular response.  Patient is CV stable . BMI 40.0-44.9, adult [facility]) An individualize plan was formulated for obesity using patient history and physical exam to encourage weight loss.  An evidence based program was formulated.  Patient is to cut portion size with meals and to plan physical exercise 3 days a week at least 20 minutes.  Weight watchers and other programs are helpful.  Planned amount of weight loss 10 lbs.  Long term (current) use of opiate analgesic AN INDIVIDUAL CARE PLAN was established and reinforced today.  The patient's status was assessed using clinical findings on exam, labs, and other diagnostic testing. Patient's success at meeting treatment goals based on disease specific evidence-bassed guidelines and found to be in fair control. RECOMMENDATIONS include maintining present medicines and treatment. He is on chronicmorphine and oxycodone with no abuse.  Negative REMS. He is having severe right knee pain with effusion and swelling  Mixed hyperlipidemia -     CBC with Differential/Platelet -     Comprehensive metabolic panel -     Lipid panel AN INDIVIDUAL CARE PLAN for hyperlipidemia/ cholesterol was established and reinforced today.  The patient's status was assessed using clinical findings on exam, lab and other diagnostic tests. The patient's disease status was assessed based on evidence-based guidelines and found to be well  controlled. MEDICATIONS were reviewed. SELF MANAGEMENT GOALS have been discussed and patient's success at attaining the goal of low cholesterol was assessed. RECOMMENDATION given include regular exercise 3 days a week and low cholesterol/low fat diet. CLINICAL SUMMARY including written plan to identify barriers unique to the patient due to social or economic  reasons was discussed.  Hypothyroidism, unspecified type Patient is known to have hyddpothyroid and is n treatment with levothyroxine 1107mcg.  Patient was diagnosed 10 years ago.  Other treatment includes none.  Patient is compliant with medicines and last TSH 6 months ago.  Last TSH was normal . Hyperglycemia -     Hemoglobin A1c AN INDIVIDUAL CARE PLAN hyperglycemia  was established and reinforced today.  The patient's status was assessed using clinical findings on exam, labs, and other diagnostic testing. Patient's success at meeting treatment goals based on disease specific evidence-bassed guidelines and found to be in fair control. RECOMMENDATIONS include be on diabetic diet other orders -     Cardiovascular Risk Assessment   After consent was obtained, using sterile technique the right knee was prepped and plain ethyl chloride  was used as local anesthetic. The joint was entered and   Steroid 80 mg and 3 ml plain Lidocaine was then injected and the needle withdrawn.  The procedure was well tolerated.  The patient is asked to continue to rest the joint for a few more days before resuming regular activities.  It may be more painful for the first 1-2 days.  Watch for fever, or increased swelling or persistent pain in the joint. Call or return to clinic prn if such symptoms occur or there is failure to improve as anticipated.      I spent 20 minutes dedicated to the care of this patient on the date of this encounter to include face-to-face time with the patient, as well as:   Follow-up: Return in about 3 months (around 03/04/2021) for  fasting.  An After Visit Summary was printed and given to the patient.  [name], MD [facility] [phone]

## 2020-12-05 LAB — COMPREHENSIVE METABOLIC PANEL
ALT: 23 IU/L (ref 0–44)
AST: 24 IU/L (ref 0–40)
Albumin/Globulin Ratio: 1.9 (ref 1.2–2.2)
Albumin: 4.5 g/dL (ref 3.8–4.8)
Alkaline Phosphatase: 111 IU/L (ref 44–121)
BUN/Creatinine Ratio: 26 — ABNORMAL HIGH (ref 10–24)
BUN: 24 mg/dL (ref 8–27)
Bilirubin Total: 0.8 mg/dL (ref 0.0–1.2)
CO2: 25 mmol/L (ref 20–29)
Calcium: 9.6 mg/dL (ref 8.6–10.2)
Chloride: 105 mmol/L (ref 96–106)
Creatinine, Ser: 0.93 mg/dL (ref 0.76–1.27)
GFR calc Af Amer: 98 mL/min/{1.73_m2} (ref >59)
GFR calc non Af Amer: 85 mL/min/{1.73_m2} (ref >59)
Globulin, Total: 2.4 g/dL (ref 1.5–4.5)
Glucose: 98 mg/dL (ref 65–99)
Potassium: 4.2 mmol/L (ref 3.5–5.2)
Sodium: 142 mmol/L (ref 134–144)
Total Protein: 6.9 g/dL (ref 6.0–8.5)

## 2020-12-05 LAB — CBC WITH DIFFERENTIAL/PLATELET
Basophils Absolute: 0.1 10*3/uL (ref 0.0–0.2)
Basos: 1 %
EOS (ABSOLUTE): 0.4 10*3/uL (ref 0.0–0.4)
Eos: 6 %
Hematocrit: 39.1 % (ref 37.5–51.0)
Hemoglobin: 12.7 g/dL — ABNORMAL LOW (ref 13.0–17.7)
Immature Grans (Abs): 0 10*3/uL (ref 0.0–0.1)
Immature Granulocytes: 0 %
Lymphocytes Absolute: 1.2 10*3/uL (ref 0.7–3.1)
Lymphs: 20 %
MCH: 27.2 pg (ref 26.6–33.0)
MCHC: 32.5 g/dL (ref 31.5–35.7)
MCV: 84 fL (ref 79–97)
Monocytes Absolute: 0.6 10*3/uL (ref 0.1–0.9)
Monocytes: 11 %
Neutrophils Absolute: 3.5 10*3/uL (ref 1.4–7.0)
Neutrophils: 62 %
Platelets: 248 10*3/uL (ref 150–450)
RBC: 4.67 x10E6/uL (ref 4.14–5.80)
RDW: 15.5 % — ABNORMAL HIGH (ref 11.6–15.4)
WBC: 5.7 10*3/uL (ref 3.4–10.8)

## 2020-12-05 LAB — LIPID PANEL
Chol/HDL Ratio: 2.6 ratio (ref 0.0–5.0)
Cholesterol, Total: 92 mg/dL — ABNORMAL LOW (ref 100–199)
HDL: 35 mg/dL — ABNORMAL LOW (ref >39)
LDL Chol Calc (NIH): 43 mg/dL (ref 0–99)
Triglycerides: 62 mg/dL (ref 0–149)
VLDL Cholesterol Cal: 14 mg/dL (ref 5–40)

## 2020-12-05 LAB — HEMOGLOBIN A1C
Est. average glucose Bld gHb Est-mCnc: 114 mg/dL
Hgb A1c MFr Bld: 5.6 % (ref 4.8–5.6)

## 2020-12-05 LAB — CARDIOVASCULAR RISK ASSESSMENT

## 2020-12-05 NOTE — Progress Notes (Signed)
Anemic, kidney and liver tests normal, A1c 5.6 good, cholesterol good lp

## 2020-12-31 DIAGNOSIS — G890 Central pain syndrome: Secondary | ICD-10-CM | POA: Diagnosis not present

## 2020-12-31 DIAGNOSIS — M19079 Primary osteoarthritis, unspecified ankle and foot: Secondary | ICD-10-CM | POA: Diagnosis not present

## 2020-12-31 DIAGNOSIS — Z79891 Long term (current) use of opiate analgesic: Secondary | ICD-10-CM | POA: Diagnosis not present

## 2021-01-26 ENCOUNTER — Other Ambulatory Visit: Payer: Self-pay

## 2021-01-26 DIAGNOSIS — Z9884 Bariatric surgery status: Secondary | ICD-10-CM

## 2021-01-26 DIAGNOSIS — E038 Other specified hypothyroidism: Secondary | ICD-10-CM

## 2021-01-26 DIAGNOSIS — G894 Chronic pain syndrome: Secondary | ICD-10-CM

## 2021-01-26 MED ORDER — LEVOTHYROXINE SODIUM 150 MCG PO TABS: 150.0000 ug | ORAL_TABLET | Freq: Every day | ORAL | 2 refills | Status: DC

## 2021-01-26 MED ORDER — FAMOTIDINE 40 MG PO TABS: 40.0000 mg | ORAL_TABLET | Freq: Two times a day (BID) | ORAL | 2 refills | Status: DC

## 2021-01-26 MED ORDER — TIZANIDINE HCL 4 MG PO TABS: 4.0000 mg | ORAL_TABLET | Freq: Three times a day (TID) | ORAL | 2 refills | Status: DC

## 2021-01-28 ENCOUNTER — Telehealth: Payer: Self-pay

## 2021-01-28 ENCOUNTER — Encounter: Payer: Self-pay | Admitting: Legal Medicine

## 2021-01-28 ENCOUNTER — Other Ambulatory Visit: Payer: Self-pay

## 2021-01-28 ENCOUNTER — Ambulatory Visit (INDEPENDENT_AMBULATORY_CARE_PROVIDER_SITE_OTHER): Payer: Medicare HMO | Admitting: Legal Medicine

## 2021-01-28 VITALS — BP 104/70 | HR 97 | Temp 97.3°F | Resp 16 | Ht 71.0 in | Wt 300.0 lb

## 2021-01-28 DIAGNOSIS — R0600 Dyspnea, unspecified: Secondary | ICD-10-CM | POA: Diagnosis not present

## 2021-01-28 DIAGNOSIS — Z9189 Other specified personal risk factors, not elsewhere classified: Secondary | ICD-10-CM | POA: Diagnosis not present

## 2021-01-28 DIAGNOSIS — Z9884 Bariatric surgery status: Secondary | ICD-10-CM

## 2021-01-28 DIAGNOSIS — G894 Chronic pain syndrome: Secondary | ICD-10-CM

## 2021-01-28 DIAGNOSIS — R0789 Other chest pain: Secondary | ICD-10-CM

## 2021-01-28 DIAGNOSIS — E038 Other specified hypothyroidism: Secondary | ICD-10-CM

## 2021-01-28 DIAGNOSIS — T7840XD Allergy, unspecified, subsequent encounter: Secondary | ICD-10-CM

## 2021-01-28 LAB — POC COVID19 BINAXNOW: SARS Coronavirus 2 Ag: NEGATIVE

## 2021-01-28 MED ORDER — LEVOTHYROXINE SODIUM 150 MCG PO TABS: 150.0000 ug | ORAL_TABLET | Freq: Every day | ORAL | 2 refills | Status: DC

## 2021-01-28 MED ORDER — MONTELUKAST SODIUM 10 MG PO TABS: 10.0000 mg | ORAL_TABLET | Freq: Every evening | ORAL | 2 refills | Status: DC

## 2021-01-28 MED ORDER — TIZANIDINE HCL 4 MG PO TABS: 4.0000 mg | ORAL_TABLET | Freq: Three times a day (TID) | ORAL | 2 refills | Status: DC

## 2021-01-28 MED ORDER — FAMOTIDINE 40 MG PO TABS: 40.0000 mg | ORAL_TABLET | Freq: Two times a day (BID) | ORAL | 2 refills | Status: DC

## 2021-01-28 NOTE — Progress Notes (Signed)
Subjective:  Patient ID: Antonio Evans, male    DOB: 1951/04/26  Age: 70 y.o. MRN: [id number]  Chief Complaint  Patient presents with  . Leg Swelling    HPI: patient has gained 10 lbs and using 60 mg furosemide.  He has lost 200cc.urine. last week woke up and unable to walk.  He had chest pain  And hurt all over for 2 days then went away. Saw cardiology January. He feels weak and  Has sallow complexion. He had Covid immunizations.   Current Outpatient Medications on File Prior to Visit  Medication Sig Dispense Refill  . apixaban (ELIQUIS) 5 MG TABS tablet Take 5 mg by mouth 2 (two) times daily.    Marland Kitchen aspirin 81 MG EC tablet Take by mouth.    . Cholecalciferol 125 MCG (5000 UT) capsule Take by mouth.    . cyanocobalamin 1000 MCG tablet Take by mouth.    . diclofenac Sodium (VOLTAREN) 1 % GEL SMARTSIG:Gram(s) Topical Twice Daily    . famotidine (PEPCID) 40 MG tablet Take 1 tablet (40 mg total) by mouth 2 (two) times daily. 180 tablet 2  . hydrOXYzine (ATARAX/VISTARIL) 25 MG tablet Take 1 tablet (25 mg total) by mouth 3 (three) times daily as needed. 90 tablet 4  . hydrOXYzine (VISTARIL) 25 MG capsule Take 25 mg by mouth 3 (three) times daily.    Marland Kitchen levothyroxine (SYNTHROID) 150 MCG tablet Take 1 tablet (150 mcg total) by mouth daily. 90 tablet 2  . metolazone (ZAROXOLYN) 5 MG tablet     . metoprolol tartrate (LOPRESSOR) 25 MG tablet Take 25 mg by mouth 2 (two) times daily.    . montelukast (SINGULAIR) 10 MG tablet Take 1 tablet (10 mg total) by mouth every evening. 90 tablet 2  . morphine (AVINZA) 60 MG 24 hr capsule Take by mouth.    . morphine (MS CONTIN) 60 MG 12 hr tablet SMARTSIG:1 Tablet(s) By Mouth Every 12 Hours    . Multiple Vitamin (MULTI-VITAMIN) tablet Take by mouth.    . Oxycodone HCl 20 MG TABS     . potassium chloride SA (KLOR-CON) 20 MEQ tablet Take 1 tablet (20 mEq total) by mouth 2 (two) times daily with a meal. 180 tablet 2  . pravastatin (PRAVACHOL) 40 MG tablet TAKE 1  TABLET BY MOUTH  TWICE DAILY 180 tablet 2  . Skin Protectants, Misc. (EUCERIN) cream Apply topically as needed for dry skin. 454 g 6  . tiZANidine (ZANAFLEX) 4 MG tablet Take 1 tablet (4 mg total) by mouth 3 (three) times daily. 405 tablet 2  . torsemide (DEMADEX) 20 MG tablet Take by mouth.     No current facility-administered medications on file prior to visit.   Past Medical History:  Diagnosis Date  . Hyperglycemia   . Hypothyroidism   . 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 Mixed hyperlipidemia   . Morbid obesity due to excess calories ([place])   . Scoliosis   . Urge incontinence    Past Surgical History:  Procedure Laterality Date  . GASTRIC BYPASS N/A   . HEMORRHOID SURGERY    . HERNIA REPAIR    . RHINOPLASTY      Family History  Problem Relation Age of Onset  . COPD Mother   . Lung cancer Father   . Diabetes Brother    Social History   Socioeconomic History  . Marital status: Single    Spouse name: Not on file  . Number of  children: Not on file  . Years of education: Not on file  . Highest education level: Not on file  Occupational History  . Not on file  Tobacco Use  . Smoking status: Former Smoker    Types: Cigarettes    Quit date: 1984    Years since quitting: 38.1  . Smokeless tobacco: Never Used  Vaping Use  . Vaping Use: Never used  Substance and Sexual Activity  . Alcohol use: Not Currently  . Drug use: Never  . Sexual activity: Not on file  Other Topics Concern  . Not on file  Social History Narrative  . Not on file   Social Determinants of Health   Financial Resource Strain: Not on file  Food Insecurity: Not on file  Transportation Needs: Not on file  Physical Activity: Not on file  Stress: Not on file  Social Connections: Not on file    Review of Systems  Constitutional: Negative for activity change, fatigue and unexpected weight change.  HENT: Negative for congestion and sinus pain.   Eyes: Negative for visual  disturbance.  Respiratory: Negative for chest tightness and shortness of breath.   Cardiovascular: Negative for chest pain, palpitations and leg swelling.  Gastrointestinal: Negative for abdominal distention and abdominal pain.  Genitourinary: Negative for difficulty urinating, dysuria and urgency.  Musculoskeletal: Negative for arthralgias and back pain.  Neurological: Negative.   Psychiatric/Behavioral: Negative.      Objective:  BP 104/70   Pulse 97   Temp (!) 97.3 F (36.3 C)   Resp 16   Ht 5\' 11"  (1.803 m)   Wt 300 lb (136.1 kg)   SpO2 93%   BMI 41.84 kg/m   BP/Weight 01/28/2021 12/04/2020 78/12/4233  Systolic BP 361 443 154  Diastolic BP 70 70 62  Wt. (Lbs) 300 290 300  BMI 41.84 40.45 41.84    Physical Exam Vitals reviewed.  Constitutional:      General: He is not in acute distress.    Appearance: Normal appearance. He is ill-appearing.  HENT:     Head: Normocephalic and atraumatic.     Right Ear: Tympanic membrane, ear canal and external ear normal.     Left Ear: Tympanic membrane, ear canal and external ear normal.     Mouth/Throat:     Mouth: Mucous membranes are dry.     Pharynx: Oropharynx is clear.  Eyes:     Extraocular Movements: Extraocular movements intact.     Conjunctiva/sclera: Conjunctivae normal.     Pupils: Pupils are equal, round, and reactive to light.  Cardiovascular:     Rate and Rhythm: Normal rate. Rhythm irregular.     Pulses: Normal pulses.     Heart sounds: Normal heart sounds. No murmur heard. No gallop.   Pulmonary:     Effort: Pulmonary effort is normal. No respiratory distress.     Breath sounds: Normal breath sounds. No rales.  Abdominal:     General: Abdomen is flat. Bowel sounds are normal. There is no distension.     Palpations: Abdomen is soft.     Tenderness: There is no abdominal tenderness.  Musculoskeletal:     Cervical back: Normal range of motion and neck supple.     Right lower leg: Edema present.     Left lower  leg: Edema present.     Comments: Right toes cyanotic 4th and 5th toes  Skin:    General: Skin is warm.     Capillary Refill: Capillary refill takes less  than 2 seconds.     Comments: Cyanosis right 4th and 5th toes  Neurological:     General: No focal deficit present.     Mental Status: He is alert and oriented to person, place, and time.       Lab Results  Component Value Date   WBC 5.7 12/04/2020   HGB 12.7 (L) 12/04/2020   HCT 39.1 12/04/2020   PLT 248 12/04/2020   GLUCOSE 98 12/04/2020   CHOL 92 (L) 12/04/2020   TRIG 62 12/04/2020   HDL 35 (L) 12/04/2020   LDLCALC 43 12/04/2020   ALT 23 12/04/2020   AST 24 12/04/2020   NA 142 12/04/2020   K 4.2 12/04/2020   CL 105 12/04/2020   CREATININE 0.93 12/04/2020   BUN 24 12/04/2020   CO2 25 12/04/2020   TSH 9.320 (H) 07/31/2020   HGBA1C 5.6 12/04/2020      Assessment & Plan:   Diagnoses and all orders for this visit: Other chest pain -     CBC with Differential/Platelet -     Comprehensive metabolic panel -     EKG [id number] -     DG Chest 2 View -     Troponin T  Arrives today with a sallow complexion and very weak he says he had severe pain throughout the chest abdomen and legs last week that lasted for 2 days without any fever chills he then took 60 of Lasix and diuresed 200 cc and is feeling somewhat better.  He still has some dyspnea on exertion but very little pedal edema.  Lungs show a few rales at the base lungs show a few rales at the base EKG shows a new right bundle branch block with minor ST-T wave changes I tried to convince the patient to go to the emergency room via ambulance but he refuses we will get blood work on him and a chest x-ray.  I have recommended The EKG showed RBBB and atrial fibrillation, nonspecific ST-T changes. I recommend the patient to allow [place] to transport him to ER via ambulance.but he refuses.  We will get chest x-ray and labs and see in one week.  I warned him that this may be cardiac  and he has no one at home to assist him.  He understands.  Covid test negative.      I spent 50 minutes dedicated to the care of this patient on the date of this encounter to include face-to-face time with the patient, as well as:   Follow-up: Return in about 1 week (around 02/04/2021).  An After Visit Summary was printed and given to the patient.  [name], MD [facility] [phone]

## 2021-01-29 ENCOUNTER — Telehealth: Payer: Self-pay

## 2021-01-29 LAB — COMPREHENSIVE METABOLIC PANEL
ALT: 26 IU/L (ref 0–44)
AST: 31 IU/L (ref 0–40)
Albumin/Globulin Ratio: 2 (ref 1.2–2.2)
Albumin: 4.4 g/dL (ref 3.8–4.8)
Alkaline Phosphatase: 99 IU/L (ref 44–121)
BUN/Creatinine Ratio: 24 (ref 10–24)
BUN: 24 mg/dL (ref 8–27)
Bilirubin Total: 0.5 mg/dL (ref 0.0–1.2)
CO2: 24 mmol/L (ref 20–29)
Calcium: 9.2 mg/dL (ref 8.6–10.2)
Chloride: 104 mmol/L (ref 96–106)
Creatinine, Ser: 1.01 mg/dL (ref 0.76–1.27)
Globulin, Total: 2.2 g/dL (ref 1.5–4.5)
Glucose: 82 mg/dL (ref 65–99)
Potassium: 4.2 mmol/L (ref 3.5–5.2)
Sodium: 144 mmol/L (ref 134–144)
Total Protein: 6.6 g/dL (ref 6.0–8.5)
eGFR: 81 mL/min/{1.73_m2} (ref >59)

## 2021-01-29 LAB — CBC WITH DIFFERENTIAL/PLATELET
Basophils Absolute: 0.1 10*3/uL (ref 0.0–0.2)
Basos: 1 %
EOS (ABSOLUTE): 0.3 10*3/uL (ref 0.0–0.4)
Eos: 5 %
Hematocrit: 37.7 % (ref 37.5–51.0)
Hemoglobin: 11.7 g/dL — ABNORMAL LOW (ref 13.0–17.7)
Immature Grans (Abs): 0 10*3/uL (ref 0.0–0.1)
Immature Granulocytes: 0 %
Lymphocytes Absolute: 1.1 10*3/uL (ref 0.7–3.1)
Lymphs: 16 %
MCH: 26.4 pg — ABNORMAL LOW (ref 26.6–33.0)
MCHC: 31 g/dL — ABNORMAL LOW (ref 31.5–35.7)
MCV: 85 fL (ref 79–97)
Monocytes Absolute: 0.6 10*3/uL (ref 0.1–0.9)
Monocytes: 9 %
Neutrophils Absolute: 4.5 10*3/uL (ref 1.4–7.0)
Neutrophils: 69 %
Platelets: 239 10*3/uL (ref 150–450)
RBC: 4.44 x10E6/uL (ref 4.14–5.80)
RDW: 14.2 % (ref 11.6–15.4)
WBC: 6.7 10*3/uL (ref 3.4–10.8)

## 2021-01-29 LAB — TROPONIN T: Troponin T (Highly Sensitive): 37 ng/L (ref 0–22)

## 2021-01-29 NOTE — Progress Notes (Signed)
Mild chronic anemia, kidney and liver tests normal,  lp

## 2021-01-29 NOTE — Telephone Encounter (Signed)
Patient requested to send the prescription to [facility].

## 2021-01-29 NOTE — Progress Notes (Signed)
Troponin t high at 37- may be cardiac still recommend going to ER lp

## 2021-01-29 NOTE — Telephone Encounter (Signed)
Dr Henrene Pastor talked with patient by phone and he explained that one of the enzymes to check for the heart (Troponin T) was elevated and patient mentioned  that he has some chest discomfort today. Dr Henrene Pastor strongly recommended patient to go to Emergency Department or call EMS. Patient refused and he said that he will go if he feels worse.

## 2021-01-30 DIAGNOSIS — M19079 Primary osteoarthritis, unspecified ankle and foot: Secondary | ICD-10-CM | POA: Diagnosis not present

## 2021-01-30 DIAGNOSIS — G890 Central pain syndrome: Secondary | ICD-10-CM | POA: Diagnosis not present

## 2021-01-30 DIAGNOSIS — Z79891 Long term (current) use of opiate analgesic: Secondary | ICD-10-CM | POA: Diagnosis not present

## 2021-02-04 ENCOUNTER — Encounter: Payer: Self-pay | Admitting: Legal Medicine

## 2021-02-04 ENCOUNTER — Ambulatory Visit (INDEPENDENT_AMBULATORY_CARE_PROVIDER_SITE_OTHER): Payer: Medicare HMO | Admitting: Legal Medicine

## 2021-02-04 ENCOUNTER — Other Ambulatory Visit: Payer: Self-pay

## 2021-02-04 DIAGNOSIS — R072 Precordial pain: Secondary | ICD-10-CM

## 2021-02-04 DIAGNOSIS — R079 Chest pain, unspecified: Secondary | ICD-10-CM | POA: Insufficient documentation

## 2021-02-04 NOTE — Progress Notes (Signed)
Subjective:  Patient ID: Antonio Evans, male    DOB: 06-Aug-1951  Age: 70 y.o. MRN: [id number]  Chief Complaint  Patient presents with  . Chest Pain    Patient states he has not hand any more chest pain since last OV.    HPI: no further chest pain and less SOB. Saw Dr. Otho Perl told him he has low EF. He came 45 minutes late.  No further chest pain, cardiology has not checked him because he had a catheterization 12 years ago which was reported as normal.  I am concerned about new CAD.  Patient does not want to pursue this.   Current Outpatient Medications on File Prior to Visit  Medication Sig Dispense Refill  . apixaban (ELIQUIS) 5 MG TABS tablet Take 5 mg by mouth 2 (two) times daily.    Marland Kitchen aspirin 81 MG EC tablet Take by mouth.    . Cholecalciferol 125 MCG (5000 UT) capsule Take by mouth.    . cyanocobalamin 1000 MCG tablet Take by mouth.    . diclofenac Sodium (VOLTAREN) 1 % GEL SMARTSIG:Gram(s) Topical Twice Daily    . famotidine (PEPCID) 40 MG tablet Take 1 tablet (40 mg total) by mouth 2 (two) times daily. 180 tablet 2  . hydrOXYzine (ATARAX/VISTARIL) 25 MG tablet Take 1 tablet (25 mg total) by mouth 3 (three) times daily as needed. 90 tablet 4  . hydrOXYzine (VISTARIL) 25 MG capsule Take 25 mg by mouth 3 (three) times daily.    Marland Kitchen levothyroxine (SYNTHROID) 150 MCG tablet Take 1 tablet (150 mcg total) by mouth daily. 90 tablet 2  . metolazone (ZAROXOLYN) 5 MG tablet     . metoprolol tartrate (LOPRESSOR) 25 MG tablet Take 25 mg by mouth 2 (two) times daily.    . montelukast (SINGULAIR) 10 MG tablet Take 1 tablet (10 mg total) by mouth every evening. 90 tablet 2  . morphine (AVINZA) 60 MG 24 hr capsule Take by mouth.    . morphine (MS CONTIN) 60 MG 12 hr tablet SMARTSIG:1 Tablet(s) By Mouth Every 12 Hours    . Multiple Vitamin (MULTI-VITAMIN) tablet Take by mouth.    . Oxycodone HCl 20 MG TABS     . potassium chloride SA (KLOR-CON) 20 MEQ tablet Take 1 tablet (20 mEq total) by mouth 2  (two) times daily with a meal. 180 tablet 2  . pravastatin (PRAVACHOL) 40 MG tablet TAKE 1 TABLET BY MOUTH  TWICE DAILY 180 tablet 2  . Skin Protectants, Misc. (EUCERIN) cream Apply topically as needed for dry skin. 454 g 6  . tiZANidine (ZANAFLEX) 4 MG tablet Take 1 tablet (4 mg total) by mouth 3 (three) times daily. 405 tablet 2  . torsemide (DEMADEX) 20 MG tablet Take by mouth.     No current facility-administered medications on file prior to visit.   Past Medical History:  Diagnosis Date  . Hyperglycemia   . Hypothyroidism   . 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 Mixed hyperlipidemia   . Morbid obesity due to excess calories ([place])   . Scoliosis   . Urge incontinence    Past Surgical History:  Procedure Laterality Date  . GASTRIC BYPASS N/A   . HEMORRHOID SURGERY    . HERNIA REPAIR    . RHINOPLASTY      Family History  Problem Relation Age of Onset  . COPD Mother   . Lung cancer Father   . Diabetes Brother    Social History  Socioeconomic History  . Marital status: Single    Spouse name: Not on file  . Number of children: Not on file  . Years of education: Not on file  . Highest education level: Not on file  Occupational History  . Not on file  Tobacco Use  . Smoking status: Former Smoker    Types: Cigarettes    Quit date: 1984    Years since quitting: 38.2  . Smokeless tobacco: Never Used  Vaping Use  . Vaping Use: Never used  Substance and Sexual Activity  . Alcohol use: Not Currently  . Drug use: Never  . Sexual activity: Not on file  Other Topics Concern  . Not on file  Social History Narrative  . Not on file   Social Determinants of Health   Financial Resource Strain: Not on file  Food Insecurity: Not on file  Transportation Needs: Not on file  Physical Activity: Not on file  Stress: Not on file  Social Connections: Not on file    Review of Systems  Constitutional: Negative for chills, fatigue and unexpected weight change.   HENT: Negative for congestion and rhinorrhea.   Eyes: Negative for visual disturbance.  Respiratory: Negative for chest tightness and shortness of breath.   Cardiovascular: Negative for chest pain, palpitations and leg swelling.  Gastrointestinal: Negative for abdominal distention and abdominal pain.  Endocrine: Negative for polyuria.  Genitourinary: Negative for dysuria and urgency.  Musculoskeletal: Positive for arthralgias. Negative for back pain.  Skin: Negative.   Allergic/Immunologic: Negative for immunocompromised state.  Neurological: Negative.   Psychiatric/Behavioral: Negative.      Objective:  BP 118/70 (BP Location: Left Arm, Patient Position: Sitting, Cuff Size: Normal)   Pulse 79   Temp (!) 97.5 F (36.4 C) (Temporal)   Resp 18   Ht 5\' 11"  (1.803 m)   Wt (!) 305 lb (138.3 kg)   SpO2 98%   BMI 42.54 kg/m   BP/Weight 02/04/2021 01/06/3150 06/03/1606  Systolic BP 371 062 694  Diastolic BP 70 70 70  Wt. (Lbs) 305 300 290  BMI 42.54 41.84 40.45    Physical Exam Vitals reviewed.  Constitutional:      Appearance: He is well-developed.  HENT:     Head: Normocephalic and atraumatic.  Cardiovascular:     Rate and Rhythm: Normal rate and regular rhythm.     Heart sounds: Normal heart sounds. No S3 or S4 sounds.   Pulmonary:     Effort: Pulmonary effort is normal.     Breath sounds: Normal breath sounds.  Abdominal:     Palpations: Abdomen is soft.  Musculoskeletal:        General: Normal range of motion.     Cervical back: Normal range of motion and neck supple.     Comments: scoliosis  Skin:    Capillary Refill: Capillary refill takes less than 2 seconds.  Neurological:     General: No focal deficit present.     Mental Status: He is alert.       Lab Results  Component Value Date   WBC 6.7 01/28/2021   HGB 11.7 (L) 01/28/2021   HCT 37.7 01/28/2021   PLT 239 01/28/2021   GLUCOSE 82 01/28/2021   CHOL 92 (L) 12/04/2020   TRIG 62 12/04/2020   HDL 35  (L) 12/04/2020   LDLCALC 43 12/04/2020   ALT 26 01/28/2021   AST 31 01/28/2021   NA 144 01/28/2021   K 4.2 01/28/2021   CL 104  01/28/2021   CREATININE 1.01 01/28/2021   BUN 24 01/28/2021   CO2 24 01/28/2021   TSH 9.320 (H) 07/31/2020   HGBA1C 5.6 12/04/2020      Assessment & Plan:  Diagnoses and all orders for this visit: Precordial pain Continue seeing Dr. Otho Perl, he may need a new cath if pain recours.        I spent 20 minutes dedicated to the care of this patient on the date of this encounter to include face-to-face time with the patient, as well as:   Follow-up: Return in about 3 months (around 05/07/2021).  An After Visit Summary was printed and given to the patient.  [name], MD [facility] [phone]

## 2021-02-09 ENCOUNTER — Encounter: Payer: Self-pay | Admitting: Legal Medicine

## 2021-03-09 DIAGNOSIS — G890 Central pain syndrome: Secondary | ICD-10-CM | POA: Diagnosis not present

## 2021-03-09 DIAGNOSIS — Z79891 Long term (current) use of opiate analgesic: Secondary | ICD-10-CM | POA: Diagnosis not present

## 2021-03-09 DIAGNOSIS — M4712 Other spondylosis with myelopathy, cervical region: Secondary | ICD-10-CM | POA: Diagnosis not present

## 2021-03-10 ENCOUNTER — Ambulatory Visit (INDEPENDENT_AMBULATORY_CARE_PROVIDER_SITE_OTHER): Payer: Medicare HMO | Admitting: Legal Medicine

## 2021-03-10 ENCOUNTER — Encounter: Payer: Self-pay | Admitting: Legal Medicine

## 2021-03-10 ENCOUNTER — Other Ambulatory Visit: Payer: Self-pay

## 2021-03-10 VITALS — BP 130/80 | HR 66 | Temp 97.3°F | Resp 17 | Ht 71.0 in | Wt 301.0 lb

## 2021-03-10 DIAGNOSIS — M41125 Adolescent idiopathic scoliosis, thoracolumbar region: Secondary | ICD-10-CM | POA: Diagnosis not present

## 2021-03-10 DIAGNOSIS — Z79891 Long term (current) use of opiate analgesic: Secondary | ICD-10-CM

## 2021-03-10 DIAGNOSIS — E039 Hypothyroidism, unspecified: Secondary | ICD-10-CM | POA: Diagnosis not present

## 2021-03-10 DIAGNOSIS — E782 Mixed hyperlipidemia: Secondary | ICD-10-CM

## 2021-03-10 DIAGNOSIS — I4819 Other persistent atrial fibrillation: Secondary | ICD-10-CM

## 2021-03-10 MED ORDER — DICYCLOMINE HCL 10 MG PO CAPS: 10.0000 mg | ORAL_CAPSULE | Freq: Three times a day (TID) | ORAL | 2 refills | Status: DC

## 2021-03-10 NOTE — Progress Notes (Signed)
Subjective:  Patient ID: Antonio Evans, male    DOB: 1951-02-28  Age: 70 y.o. MRN: [id number]  Chief Complaint  Patient presents with  . Hyperlipidemia  . Hypothyroidism    HPI: chronic visit  Patient presents with hyperlipidemia.  Compliance with treatment has been good; patient takes medicines as directed, maintains low cholesterol diet, follows up as directed, and maintains exercise regimen.  Patient is using pravastatin without problems.  Patient has HYPOTHYROIDISM.  Diagnosed 10 years ago.  Patient has stable thyroid readings.  Patient is having no symptoms.  Last TSH was normal.  continue dosage of thyroid medicine.   Current Outpatient Medications on File Prior to Visit  Medication Sig Dispense Refill  . apixaban (ELIQUIS) 5 MG TABS tablet Take 5 mg by mouth 2 (two) times daily.    Marland Kitchen aspirin 81 MG EC tablet Take by mouth.    . Cholecalciferol 125 MCG (5000 UT) capsule Take by mouth.    . cyanocobalamin 1000 MCG tablet Take by mouth.    . diclofenac Sodium (VOLTAREN) 1 % GEL SMARTSIG:Gram(s) Topical Twice Daily    . famotidine (PEPCID) 40 MG tablet Take 1 tablet (40 mg total) by mouth 2 (two) times daily. 180 tablet 2  . hydrOXYzine (VISTARIL) 25 MG capsule Take 25 mg by mouth 3 (three) times daily.    Marland Kitchen levothyroxine (SYNTHROID) 150 MCG tablet Take 1 tablet (150 mcg total) by mouth daily. 90 tablet 2  . metolazone (ZAROXOLYN) 5 MG tablet     . metoprolol tartrate (LOPRESSOR) 25 MG tablet Take 25 mg by mouth 2 (two) times daily.    . montelukast (SINGULAIR) 10 MG tablet Take 1 tablet (10 mg total) by mouth every evening. 90 tablet 2  . morphine (AVINZA) 60 MG 24 hr capsule Take by mouth.    . morphine (MS CONTIN) 60 MG 12 hr tablet SMARTSIG:1 Tablet(s) By Mouth Every 12 Hours    . Multiple Vitamin (MULTI-VITAMIN) tablet Take by mouth.    . Oxycodone HCl 20 MG TABS     . potassium chloride SA (KLOR-CON) 20 MEQ tablet Take 1 tablet (20 mEq total) by mouth 2 (two) times daily  with a meal. 180 tablet 2  . pravastatin (PRAVACHOL) 40 MG tablet TAKE 1 TABLET BY MOUTH  TWICE DAILY 180 tablet 2  . Skin Protectants, Misc. (EUCERIN) cream Apply topically as needed for dry skin. 454 g 6  . tiZANidine (ZANAFLEX) 4 MG tablet Take 1 tablet (4 mg total) by mouth 3 (three) times daily. 405 tablet 2  . torsemide (DEMADEX) 20 MG tablet Take by mouth.     No current facility-administered medications on file prior to visit.   Past Medical History:  Diagnosis Date  . Hyperglycemia   . Hypothyroidism   . 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 Mixed hyperlipidemia   . Morbid obesity due to excess calories ([place])   . Scoliosis   . Urge incontinence    Past Surgical History:  Procedure Laterality Date  . GASTRIC BYPASS N/A   . HEMORRHOID SURGERY    . HERNIA REPAIR    . RHINOPLASTY      Family History  Problem Relation Age of Onset  . COPD Mother   . Lung cancer Father   . Diabetes Brother    Social History   Socioeconomic History  . Marital status: Single    Spouse name: Not on file  . Number of children: Not on file  .  Years of education: Not on file  . Highest education level: Not on file  Occupational History  . Not on file  Tobacco Use  . Smoking status: Former Smoker    Types: Cigarettes    Quit date: 1984    Years since quitting: 38.3  . Smokeless tobacco: Never Used  Vaping Use  . Vaping Use: Never used  Substance and Sexual Activity  . Alcohol use: Not Currently  . Drug use: Never  . Sexual activity: Not on file  Other Topics Concern  . Not on file  Social History Narrative  . Not on file   Social Determinants of Health   Financial Resource Strain: Not on file  Food Insecurity: Not on file  Transportation Needs: Not on file  Physical Activity: Not on file  Stress: Not on file  Social Connections: Not on file    Review of Systems  Constitutional: Negative for activity change and appetite change.  HENT: Negative for congestion  and sinus pain.   Eyes: Negative for visual disturbance.  Respiratory: Negative for chest tightness and shortness of breath.   Cardiovascular: Negative for chest pain, palpitations and leg swelling.  Gastrointestinal: Negative for abdominal distention and abdominal pain.  Endocrine: Negative for polyuria.  Genitourinary: Negative for difficulty urinating and dysuria.  Musculoskeletal: Positive for arthralgias and back pain.  Skin: Negative.   Neurological: Negative.   Psychiatric/Behavioral: Negative.      Objective:  BP 130/80   Pulse 66   Temp (!) 97.3 F (36.3 C)   Resp 17   Ht 5\' 11"  (1.803 m)   Wt (!) 301 lb (136.5 kg)   SpO2 97%   BMI 41.98 kg/m   BP/Weight 03/10/2021 07/05/6810 04/05/2619  Systolic BP 355 974 163  Diastolic BP 80 70 70  Wt. (Lbs) 301 305 300  BMI 41.98 42.54 41.84    Physical Exam Vitals reviewed.  Constitutional:      Appearance: Normal appearance.  HENT:     Head: Normocephalic.     Right Ear: Tympanic membrane normal.     Left Ear: Tympanic membrane normal.     Nose: Nose normal.     Mouth/Throat:     Mouth: Mucous membranes are moist.     Pharynx: Oropharynx is clear.  Eyes:     Extraocular Movements: Extraocular movements intact.     Conjunctiva/sclera: Conjunctivae normal.     Pupils: Pupils are equal, round, and reactive to light.  Cardiovascular:     Rate and Rhythm: Normal rate and regular rhythm.     Pulses: Normal pulses.     Heart sounds: Normal heart sounds. No murmur heard. No gallop.   Pulmonary:     Effort: Pulmonary effort is normal. No respiratory distress.     Breath sounds: No rales.  Abdominal:     General: Abdomen is flat. Bowel sounds are normal. There is no distension.     Palpations: Abdomen is soft.     Tenderness: There is no abdominal tenderness.  Musculoskeletal:        General: Tenderness and deformity (scoliosis) present. Normal range of motion.     Cervical back: Normal range of motion and neck supple.   Skin:    General: Skin is warm and dry.     Capillary Refill: Capillary refill takes less than 2 seconds.     Comments: Small ulcer left leg  Neurological:     General: No focal deficit present.     Mental Status: He  is alert and oriented to person, place, and time. Mental status is at baseline.  Psychiatric:        Mood and Affect: Mood normal.        Behavior: Behavior normal.        Thought Content: Thought content normal.       Lab Results  Component Value Date   WBC 6.7 01/28/2021   HGB 11.7 (L) 01/28/2021   HCT 37.7 01/28/2021   PLT 239 01/28/2021   GLUCOSE 82 01/28/2021   CHOL 92 (L) 12/04/2020   TRIG 62 12/04/2020   HDL 35 (L) 12/04/2020   LDLCALC 43 12/04/2020   ALT 26 01/28/2021   AST 31 01/28/2021   NA 144 01/28/2021   K 4.2 01/28/2021   CL 104 01/28/2021   CREATININE 1.01 01/28/2021   BUN 24 01/28/2021   CO2 24 01/28/2021   TSH 9.320 (H) 07/31/2020   HGBA1C 5.6 12/04/2020      Assessment & Plan:   Diagnoses and all orders for this visit: Persistent atrial fibrillation ([place]) -     CBC with Differential/Platelet -     Comprehensive metabolic panel Patient has a diagnosis of permanent atrial fibrillation.   Patient is on eliquis and has controlled ventricular response.  Patient is CV stable . Acquired hypothyroidism -     TSH Patient is known to have hypothyroidism and is n treatment with levothyroxine 153mcg.  Patient was diagnosed 10 years ago.  Other treatment includes none.  Patient is compliant with medicines and last TSH 6 months ago.  Last TSH was normal . 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  The patient's status was assessed using clinical findings on exam, labs, and other diagnostic testing. Patient's success at meeting treatment goals based on disease specific evidence-bassed  guidelines oxycodone and morphine and found to be in fair control. RECOMMENDATIONS include maintining present medicines and treatment. He is on chronic with no abuse.  Negative REMS.  Mixed hyperlipidemia -     CBC with Differential/Platelet -     Comprehensive metabolic panel -     Lipid panel AN INDIVIDUAL CARE PLAN for hyperlipidemia/ cholesterol was established and reinforced today.  The patient's status was assessed using clinical findings on exam, lab and other diagnostic tests. The patient's disease status was assessed based on evidence-based guidelines and found to be well controlled. MEDICATIONS were reviewed. SELF MANAGEMENT GOALS have been discussed and patient's success at attaining the goal of low cholesterol was assessed. RECOMMENDATION given include regular exercise 3 days a week and low cholesterol/low fat diet. CLINICAL SUMMARY including written plan to identify barriers unique to the patient due to social or economic  reasons was discussed.  Morbid obesity due to excess calories [facility]) An individualize plan was formulated for obesity using patient history and physical exam to encourage weight loss.  An evidence based program was formulated.  Patient is to cut portion size with meals and to plan physical exercise 3 days a week at least 20 minutes.  Weight watchers and other programs are helpful.  Planned amount of weight loss 10 lbs. Other orders -     dicyclomine (BENTYL) 10 MG capsule; Take 1 capsule (10 mg total) by mouth 4 (four) times daily -  before meals and at bedtime.        Follow-up: Return in about 3 months (around  06/09/2021) for fasting.  An After Visit Summary was printed and given to the patient.  [name], MD [facility] [phone]

## 2021-03-11 ENCOUNTER — Other Ambulatory Visit: Payer: Self-pay

## 2021-03-11 ENCOUNTER — Other Ambulatory Visit: Payer: Self-pay | Admitting: Legal Medicine

## 2021-03-11 DIAGNOSIS — E038 Other specified hypothyroidism: Secondary | ICD-10-CM

## 2021-03-11 LAB — CBC WITH DIFFERENTIAL/PLATELET
Basophils Absolute: 0.1 10*3/uL (ref 0.0–0.2)
Basos: 1 %
EOS (ABSOLUTE): 0.2 10*3/uL (ref 0.0–0.4)
Eos: 4 %
Hematocrit: 37.4 % — ABNORMAL LOW (ref 37.5–51.0)
Hemoglobin: 11.8 g/dL — ABNORMAL LOW (ref 13.0–17.7)
Immature Grans (Abs): 0 10*3/uL (ref 0.0–0.1)
Immature Granulocytes: 0 %
Lymphocytes Absolute: 1 10*3/uL (ref 0.7–3.1)
Lymphs: 20 %
MCH: 26.5 pg — ABNORMAL LOW (ref 26.6–33.0)
MCHC: 31.6 g/dL (ref 31.5–35.7)
MCV: 84 fL (ref 79–97)
Monocytes Absolute: 0.5 10*3/uL (ref 0.1–0.9)
Monocytes: 10 %
Neutrophils Absolute: 3.2 10*3/uL (ref 1.4–7.0)
Neutrophils: 65 %
Platelets: 199 10*3/uL (ref 150–450)
RBC: 4.45 x10E6/uL (ref 4.14–5.80)
RDW: 14.2 % (ref 11.6–15.4)
WBC: 4.9 10*3/uL (ref 3.4–10.8)

## 2021-03-11 LAB — COMPREHENSIVE METABOLIC PANEL
ALT: 24 IU/L (ref 0–44)
AST: 25 IU/L (ref 0–40)
Albumin/Globulin Ratio: 1.9 (ref 1.2–2.2)
Albumin: 4.5 g/dL (ref 3.8–4.8)
Alkaline Phosphatase: 102 IU/L (ref 44–121)
BUN/Creatinine Ratio: 17 (ref 10–24)
BUN: 16 mg/dL (ref 8–27)
Bilirubin Total: 0.6 mg/dL (ref 0.0–1.2)
CO2: 22 mmol/L (ref 20–29)
Calcium: 9.5 mg/dL (ref 8.6–10.2)
Chloride: 103 mmol/L (ref 96–106)
Creatinine, Ser: 0.94 mg/dL (ref 0.76–1.27)
Globulin, Total: 2.4 g/dL (ref 1.5–4.5)
Glucose: 80 mg/dL (ref 65–99)
Potassium: 4.6 mmol/L (ref 3.5–5.2)
Sodium: 141 mmol/L (ref 134–144)
Total Protein: 6.9 g/dL (ref 6.0–8.5)
eGFR: 88 mL/min/{1.73_m2} (ref >59)

## 2021-03-11 LAB — LIPID PANEL
Chol/HDL Ratio: 2.4 ratio (ref 0.0–5.0)
Cholesterol, Total: 95 mg/dL — ABNORMAL LOW (ref 100–199)
HDL: 40 mg/dL (ref >39)
LDL Chol Calc (NIH): 43 mg/dL (ref 0–99)
Triglycerides: 50 mg/dL (ref 0–149)
VLDL Cholesterol Cal: 12 mg/dL (ref 5–40)

## 2021-03-11 LAB — TSH: TSH: 7.19 u[IU]/mL — ABNORMAL HIGH (ref 0.450–4.500)

## 2021-03-11 LAB — CARDIOVASCULAR RISK ASSESSMENT

## 2021-03-11 MED ORDER — LEVOTHYROXINE SODIUM 175 MCG PO TABS: 175.0000 ug | ORAL_TABLET | Freq: Every day | ORAL | 2 refills | Status: DC

## 2021-03-11 NOTE — Progress Notes (Signed)
Chronic anemia, kidney and liver tests normal, cholesterol normal, TSH high at 7.19- increase thyroid to 172mcg, repeat TSH in 6 weeks,  lp

## 2021-04-01 ENCOUNTER — Other Ambulatory Visit: Payer: Self-pay

## 2021-04-01 MED ORDER — POTASSIUM CHLORIDE CRYS ER 20 MEQ PO TBCR: 20.0000 meq | EXTENDED_RELEASE_TABLET | Freq: Two times a day (BID) | ORAL | 2 refills | Status: DC

## 2021-04-14 ENCOUNTER — Other Ambulatory Visit: Payer: Self-pay

## 2021-04-14 MED ORDER — METOPROLOL TARTRATE 25 MG PO TABS: 25.0000 mg | ORAL_TABLET | Freq: Two times a day (BID) | ORAL | 2 refills | Status: DC

## 2021-04-14 MED ORDER — FUROSEMIDE 20 MG PO TABS: 20.0000 mg | ORAL_TABLET | Freq: Every day | ORAL | 2 refills | Status: AC

## 2021-04-23 DIAGNOSIS — E785 Hyperlipidemia, unspecified: Secondary | ICD-10-CM | POA: Diagnosis not present

## 2021-04-23 DIAGNOSIS — R079 Chest pain, unspecified: Secondary | ICD-10-CM | POA: Diagnosis not present

## 2021-04-23 DIAGNOSIS — Z7901 Long term (current) use of anticoagulants: Secondary | ICD-10-CM | POA: Diagnosis not present

## 2021-04-23 DIAGNOSIS — R609 Edema, unspecified: Secondary | ICD-10-CM | POA: Diagnosis not present

## 2021-04-23 DIAGNOSIS — I4819 Other persistent atrial fibrillation: Secondary | ICD-10-CM | POA: Diagnosis not present

## 2021-04-27 ENCOUNTER — Other Ambulatory Visit: Payer: Self-pay | Admitting: Legal Medicine

## 2021-05-21 ENCOUNTER — Telehealth: Payer: Self-pay

## 2021-05-21 NOTE — Telephone Encounter (Signed)
Patient asked if you received any order for MRI from neurologist?

## 2021-05-21 NOTE — Telephone Encounter (Signed)
I called patient to let him know that we received the fax from neurology.

## 2021-05-21 NOTE — Telephone Encounter (Signed)
WE have not received and notice to schedule MRI lp

## 2021-05-27 ENCOUNTER — Other Ambulatory Visit: Payer: Self-pay

## 2021-05-27 DIAGNOSIS — M5134 Other intervertebral disc degeneration, thoracic region: Secondary | ICD-10-CM

## 2021-05-27 DIAGNOSIS — M25559 Pain in unspecified hip: Secondary | ICD-10-CM

## 2021-05-27 DIAGNOSIS — M4802 Spinal stenosis, cervical region: Secondary | ICD-10-CM | POA: Insufficient documentation

## 2021-05-27 DIAGNOSIS — G8929 Other chronic pain: Secondary | ICD-10-CM

## 2021-05-27 DIAGNOSIS — M4804 Spinal stenosis, thoracic region: Secondary | ICD-10-CM | POA: Insufficient documentation

## 2021-05-27 DIAGNOSIS — N39498 Other specified urinary incontinence: Secondary | ICD-10-CM

## 2021-06-03 ENCOUNTER — Other Ambulatory Visit: Payer: Self-pay

## 2021-06-08 ENCOUNTER — Other Ambulatory Visit: Payer: Self-pay

## 2021-06-08 MED ORDER — PRAVASTATIN SODIUM 40 MG PO TABS: 40.0000 mg | ORAL_TABLET | Freq: Two times a day (BID) | ORAL | 2 refills | Status: DC

## 2021-06-09 DIAGNOSIS — M19079 Primary osteoarthritis, unspecified ankle and foot: Secondary | ICD-10-CM | POA: Diagnosis not present

## 2021-06-09 DIAGNOSIS — M4712 Other spondylosis with myelopathy, cervical region: Secondary | ICD-10-CM | POA: Diagnosis not present

## 2021-06-09 DIAGNOSIS — Z79891 Long term (current) use of opiate analgesic: Secondary | ICD-10-CM | POA: Diagnosis not present

## 2021-06-09 DIAGNOSIS — G890 Central pain syndrome: Secondary | ICD-10-CM | POA: Diagnosis not present

## 2021-06-10 ENCOUNTER — Ambulatory Visit: Payer: Medicare HMO | Admitting: Legal Medicine

## 2021-06-18 ENCOUNTER — Other Ambulatory Visit: Payer: Self-pay

## 2021-06-18 ENCOUNTER — Ambulatory Visit
Admission: RE | Admit: 2021-06-18 | Discharge: 2021-06-18 | Disposition: A | Discharge status: Home / Self Care | Payer: Medicare HMO | Source: Ambulatory Visit | Attending: Legal Medicine | Admitting: Legal Medicine

## 2021-06-18 DIAGNOSIS — M5124 Other intervertebral disc displacement, thoracic region: Secondary | ICD-10-CM | POA: Diagnosis not present

## 2021-06-18 DIAGNOSIS — M50323 Other cervical disc degeneration at C6-C7 level: Secondary | ICD-10-CM | POA: Diagnosis not present

## 2021-06-18 DIAGNOSIS — M5134 Other intervertebral disc degeneration, thoracic region: Secondary | ICD-10-CM

## 2021-06-18 DIAGNOSIS — M47812 Spondylosis without myelopathy or radiculopathy, cervical region: Secondary | ICD-10-CM | POA: Diagnosis not present

## 2021-06-18 DIAGNOSIS — N39498 Other specified urinary incontinence: Secondary | ICD-10-CM

## 2021-06-18 DIAGNOSIS — M4802 Spinal stenosis, cervical region: Secondary | ICD-10-CM

## 2021-06-18 DIAGNOSIS — G8929 Other chronic pain: Secondary | ICD-10-CM

## 2021-06-18 DIAGNOSIS — M47814 Spondylosis without myelopathy or radiculopathy, thoracic region: Secondary | ICD-10-CM | POA: Diagnosis not present

## 2021-06-18 DIAGNOSIS — M25559 Pain in unspecified hip: Secondary | ICD-10-CM

## 2021-06-21 NOTE — Progress Notes (Signed)
MRI severe c3 narrowing causing spinal cord compression left, degenerative changes thoracic spine lp

## 2021-07-10 DIAGNOSIS — M19079 Primary osteoarthritis, unspecified ankle and foot: Secondary | ICD-10-CM | POA: Diagnosis not present

## 2021-07-10 DIAGNOSIS — Z79891 Long term (current) use of opiate analgesic: Secondary | ICD-10-CM | POA: Diagnosis not present

## 2021-07-10 DIAGNOSIS — M4712 Other spondylosis with myelopathy, cervical region: Secondary | ICD-10-CM | POA: Diagnosis not present

## 2021-07-10 DIAGNOSIS — G890 Central pain syndrome: Secondary | ICD-10-CM | POA: Diagnosis not present

## 2021-07-13 ENCOUNTER — Telehealth: Payer: Self-pay

## 2021-07-13 MED ORDER — POTASSIUM CHLORIDE CRYS ER 20 MEQ PO TBCR: 20.0000 meq | EXTENDED_RELEASE_TABLET | Freq: Two times a day (BID) | ORAL | 2 refills | Status: DC

## 2021-07-13 NOTE — Telephone Encounter (Signed)
Refill sent to pharmacy.   

## 2021-07-14 ENCOUNTER — Ambulatory Visit (INDEPENDENT_AMBULATORY_CARE_PROVIDER_SITE_OTHER): Payer: Medicare HMO | Admitting: Legal Medicine

## 2021-07-14 ENCOUNTER — Other Ambulatory Visit: Payer: Self-pay

## 2021-07-14 ENCOUNTER — Encounter: Payer: Self-pay | Admitting: Legal Medicine

## 2021-07-14 VITALS — BP 120/80 | HR 71 | Temp 97.6°F | Resp 16 | Ht 71.0 in | Wt 291.0 lb

## 2021-07-14 DIAGNOSIS — M5134 Other intervertebral disc degeneration, thoracic region: Secondary | ICD-10-CM | POA: Diagnosis not present

## 2021-07-14 DIAGNOSIS — D6869 Other thrombophilia: Secondary | ICD-10-CM

## 2021-07-14 DIAGNOSIS — Z79891 Long term (current) use of opiate analgesic: Secondary | ICD-10-CM

## 2021-07-14 DIAGNOSIS — I4819 Other persistent atrial fibrillation: Secondary | ICD-10-CM | POA: Diagnosis not present

## 2021-07-14 DIAGNOSIS — E039 Hypothyroidism, unspecified: Secondary | ICD-10-CM

## 2021-07-14 DIAGNOSIS — E782 Mixed hyperlipidemia: Secondary | ICD-10-CM

## 2021-07-14 MED ORDER — PREDNISONE 50 MG PO TABS: 50.0000 mg | ORAL_TABLET | Freq: Every day | ORAL | 0 refills | Status: DC

## 2021-07-14 NOTE — Progress Notes (Addendum)
Established Patient Office Visit  Subjective:  Patient ID: Antonio Evans, male    DOB: [birth year]  Age: 70 y.o. MRN: [id number]  CC:  Chief Complaint  Patient presents with   Hypothyroidism   Hyperlipidemia   Atrial Fibrillation    HPI [name] presents for chronic visit.  Patient has a diagnosis of chronic atrial fibrillation.   Patient is on eliquis and has controlled ventricular response.  Patient is CV stable.   Patient presents with hyperlipidemia.  Compliance with treatment has been good; patient takes medicines as directed, maintains low cholesterol diet, follows up as directed, and maintains exercise regimen.  Patient is using pravastatin without problems.   Patient has HYPOTHYROIDISM.  Diagnosed 10 years ago.  Patient has stable thyroid readings.  Patient is having no symptoms.  Last TSH was normal.  continue dosage of thyroid medicine.    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    Mixed hyperlipidemia    Morbid obesity due to excess calories ([place])    Scoliosis    Urge incontinence     Past Surgical History:  Procedure Laterality Date   GASTRIC BYPASS N/A    HEMORRHOID SURGERY     HERNIA REPAIR     RHINOPLASTY      Family History  Problem Relation Age of Onset   COPD Mother    Lung cancer Father    Diabetes Brother     Social History   Socioeconomic History   Marital status: Single    Spouse name: Not on file   Number of children: Not on file   Years of education: Not on file   Highest education level: Not on file  Occupational History   Not on file  Tobacco Use   Smoking status: Former    Types: Cigarettes    Quit date: 1984    Years since quitting: 38.6   Smokeless tobacco: Never  Vaping Use   Vaping Use: Never used  Substance and Sexual Activity   Alcohol use: Not Currently   Drug use: Never   Sexual activity: Not on file  Other Topics Concern   Not  on file  Social History Narrative   Not on file   Social Determinants of Health   Financial Resource Strain: Not on file  Food Insecurity: Not on file  Transportation Needs: Not on file  Physical Activity: Not on file  Stress: Not on file  Social Connections: Not on file  Intimate Partner Violence: Not on file    Outpatient Medications Prior to Visit  Medication Sig Dispense Refill   apixaban (ELIQUIS) 5 MG TABS tablet Take 5 mg by mouth 2 (two) times daily.     aspirin 81 MG EC tablet Take by mouth.     aspirin 81 MG EC tablet Take by mouth.     Cholecalciferol 125 MCG (5000 UT) capsule Take by mouth.     cyanocobalamin 1000 MCG tablet Take by mouth.     diclofenac Sodium (VOLTAREN) 1 % GEL SMARTSIG:Gram(s) Topical Twice Daily     dicyclomine (BENTYL) 10 MG capsule TAKE 1 CAPSULE (10 MG TOTAL) BY MOUTH 4 (FOUR) TIMES DAILY -  BEFORE MEALS AND AT BEDTIME. 270 capsule 2   famotidine (PEPCID) 40 MG tablet Take 1 tablet (40 mg total) by mouth 2 (two) times daily. 180 tablet 2   furosemide (LASIX) 20 MG tablet Take 1 tablet (  20 mg total) by mouth daily. 90 tablet 2   hydrOXYzine (VISTARIL) 25 MG capsule Take 25 mg by mouth 3 (three) times daily.     levothyroxine (SYNTHROID) 175 MCG tablet Take 1 tablet (175 mcg total) by mouth daily. 90 tablet 2   metolazone (ZAROXOLYN) 5 MG tablet      metoprolol tartrate (LOPRESSOR) 25 MG tablet Take 1 tablet (25 mg total) by mouth 2 (two) times daily. 180 tablet 2   montelukast (SINGULAIR) 10 MG tablet Take 1 tablet (10 mg total) by mouth every evening. 90 tablet 2   morphine (MS CONTIN) 60 MG 12 hr tablet SMARTSIG:1 Tablet(s) By Mouth Every 12 Hours     Multiple Vitamin (MULTI-VITAMIN) tablet Take by mouth.     Oxycodone HCl 20 MG TABS      potassium chloride SA (KLOR-CON) 20 MEQ tablet Take 1 tablet (20 mEq total) by mouth 2 (two) times daily with a meal. 180 tablet 2   pravastatin (PRAVACHOL) 40 MG tablet Take 1 tablet (40 mg total) by mouth 2  (two) times daily. 180 tablet 2   Skin Protectants, Misc. (EUCERIN) cream Apply topically as needed for dry skin. 454 g 6   tiZANidine (ZANAFLEX) 4 MG tablet Take 1 tablet (4 mg total) by mouth 3 (three) times daily. 405 tablet 2   torsemide (DEMADEX) 20 MG tablet Take by mouth.     morphine (AVINZA) 60 MG 24 hr capsule Take by mouth.     No facility-administered medications prior to visit.    Allergies  Allergen Reactions   Bee Pollen Shortness Of Breath and Swelling   Enoxaparin Other (See Comments)    Other reaction(s): Bleeding (intolerance)  Other reaction(s): Bleeding (intolerance) Other reaction(s): Bleeding (intolerance)    Pollen Extract Shortness Of Breath and Swelling   Gabapentin Other (See Comments)    GI upset Other reaction(s): Other (See Comments) GI upset  GI upset Other reaction(s): Other (See Comments) GI upset Other reaction(s): Other (See Comments) GI upset GI upset    Levofloxacin Rash   Sulfamethoxazole-Trimethoprim Other (See Comments)    UNK   Sulfamethoxazole Other (See Comments)    Other reaction(s): Other (See Comments)     ROS Review of Systems  Constitutional:  Negative for chills, fatigue and fever.  HENT:  Negative for congestion, ear pain and sore throat.   Respiratory:  Negative for cough and shortness of breath.   Cardiovascular:  Negative for chest pain.  Gastrointestinal:  Negative for abdominal pain, constipation, diarrhea, nausea and vomiting.  Endocrine: Negative for polydipsia, polyphagia and polyuria.  Genitourinary:  Negative for dysuria and frequency.       Some incontinence  Musculoskeletal:  Negative for arthralgias and myalgias.       More pain and difficulty walking  Skin: Negative.   Neurological:  Negative for dizziness and headaches.  Psychiatric/Behavioral:  Negative for dysphoric mood.        No dysphoria     Objective:    Physical Exam Vitals reviewed. Nursing note reviewed: walking with  cane. Constitutional:      General: He is not in acute distress.    Appearance: Normal appearance. He is obese.  HENT:     Head: Normocephalic.     Right Ear: Tympanic membrane normal.     Left Ear: Tympanic membrane normal.     Nose: Nose normal.     Mouth/Throat:     Mouth: Mucous membranes are dry.     Pharynx: Oropharynx  is clear.  Eyes:     Extraocular Movements: Extraocular movements intact.     Conjunctiva/sclera: Conjunctivae normal.     Pupils: Pupils are equal, round, and reactive to light.  Cardiovascular:     Rate and Rhythm: Normal rate and regular rhythm.     Pulses: Normal pulses.     Heart sounds: No murmur heard.   No gallop.  Pulmonary:     Effort: Pulmonary effort is normal. No respiratory distress.     Breath sounds: Normal breath sounds. No wheezing.  Abdominal:     General: Abdomen is flat. Bowel sounds are normal. There is no distension.     Palpations: Abdomen is soft.     Tenderness: There is no abdominal tenderness.  Musculoskeletal:        General: Normal range of motion.     Cervical back: Normal range of motion.     Right lower leg: No edema.     Left lower leg: No edema.  Skin:    General: Skin is warm and dry.     Capillary Refill: Capillary refill takes less than 2 seconds.  Neurological:     Mental Status: He is alert and oriented to person, place, and time.     Sensory: Sensory deficit present.     Motor: Weakness present.     Gait: Gait abnormal.  Psychiatric:        Mood and Affect: Mood normal.        Thought Content: Thought content normal.        Judgment: Judgment normal.    BP 120/80   Pulse 71   Temp 97.6 F (36.4 C)   Resp 16   Ht 5' 11"  (1.803 m)   Wt 291 lb (132 kg)   SpO2 97%   BMI 40.59 kg/m  Wt Readings from Last 3 Encounters:  07/14/21 291 lb (132 kg)  03/10/21 (!) 301 lb (136.5 kg)  02/04/21 (!) 305 lb (138.3 kg)     Health Maintenance Due  Topic Date Due   Hepatitis C Screening  Never done    COLONOSCOPY (Pts 45-36yr Insurance coverage will need to be confirmed)  Never done   Zoster Vaccines- Shingrix (1 of 2) Never done   PNA vac Low Risk Adult (1 of 2 - PCV13) Never done   INFLUENZA VACCINE  06/29/2021    There are no preventive care reminders to display for this patient.  Lab Results  Component Value Date   TSH 7.190 (H) 03/10/2021   Lab Results  Component Value Date   WBC 4.9 03/10/2021   HGB 11.8 (L) 03/10/2021   HCT 37.4 (L) 03/10/2021   MCV 84 03/10/2021   PLT 199 03/10/2021   Lab Results  Component Value Date   NA 141 03/10/2021   K 4.6 03/10/2021   CO2 22 03/10/2021   GLUCOSE 80 03/10/2021   BUN 16 03/10/2021   CREATININE 0.94 03/10/2021   BILITOT 0.6 03/10/2021   ALKPHOS 102 03/10/2021   AST 25 03/10/2021   ALT 24 03/10/2021   PROT 6.9 03/10/2021   ALBUMIN 4.5 03/10/2021   CALCIUM 9.5 03/10/2021   EGFR 88 03/10/2021   Lab Results  Component Value Date   CHOL 95 (L) 03/10/2021   Lab Results  Component Value Date   HDL 40 03/10/2021   Lab Results  Component Value Date   LDLCALC 43 03/10/2021   Lab Results  Component Value Date   TRIG 50 03/10/2021  Lab Results  Component Value Date   CHOLHDL 2.4 03/10/2021   Lab Results  Component Value Date   HGBA1C 5.6 12/04/2020      Assessment & Plan:   Problem List Items Addressed This Visit       Cardiovascular and Mediastinum   Persistent atrial fibrillation (HCC)   Relevant Medications   aspirin 81 MG EC tablet   Other Relevant Orders   Comprehensive metabolic panel   CBC with Differential/Platelet   AMB Referral to [place] Patient has a diagnosis of chronic atrial fibrillation.   Patient is on eliquis and has controlled ventricular response.  Patient is CV stable.      Endocrine   Hypothyroidism - Primary   Relevant Orders   TSH   AMB [facility]erral to [facility] Patient is known to have hypothyroidism and is on treatment with 124mg.   Patient was diagnosed 10 years ago.  Other treatment includes none.  Patient is compliant with medicines and last TSH 8 months ago.  Last TSH was normal.      Other   Mixed hyperlipidemia   Relevant Medications   aspirin 81 MG EC tablet   Other Relevant Orders   Lipid panel AN INDIVIDUAL CARE PLAN for hyperlipidemia/ cholesterol was established and reinforced today.  The patient's status was assessed using clinical findings on exam, lab and other diagnostic tests. The patient's disease status was assessed based on evidence-based guidelines and found to be fair controlled. MEDICATIONS were reviewed. SELF MANAGEMENT GOALS have been discussed and patient's success at attaining the goal of low cholesterol was assessed. RECOMMENDATION given include regular exercise 3 days a week and low cholesterol/low fat diet. CLINICAL SUMMARY including written plan to identify barriers unique to the patient due to social or economic  reasons was discussed.     Morbid obesity due to excess calories ([facility] An individualize plan was formulated for obesity using patient history and physical exam to encourage weight loss.  An evidence based program was formulated.  Patient is to cut portion size with meals and to plan physical exercise 3 days a week at least 20 minutes.  Weight watchers and other programs are helpful.  Planned amount of weight loss 10 lbs.     Long term (current) use of opiate analgesic   Relevant Orders   AMB Referral to [place] and reinforced today.  The patient's status was assessed using clinical findings on exam, labs, and other diagnostic testing. Patient's success at meeting treatment goals based on disease specific evidence-bassed guidelines and found to be in fair control. RECOMMENDATIONS include maintining present medicines and treatment. He is on chronicmorphine with no abuse.  Negative REMS.    Other Visit Diagnoses     Other  intervertebral disc degeneration, thoracic region       Relevant Medications   aspirin 81 MG EC tablet   predniSONE (DELTASONE) 50 MG tablet   Other Relevant Orders   AMB Referral to CMauiHe is having worsening C3-4 disc encroachment and he will see neurosurgeon for possible surgery to prevent paraplegia.  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 -     Cardiovascular Risk Assessment   Meds ordered this encounter  Medications   predniSONE (DELTASONE) 50 MG tablet    Sig: Take 1 tablet (50 mg total) by mouth daily with breakfast.    Dispense:  5 tablet    Refill:  0  30 minute visit and review of records  Follow-up: Return in about 6 months (around 01/14/2022) for fasting.    [name], MD

## 2021-07-15 ENCOUNTER — Telehealth: Payer: Self-pay | Admitting: Legal Medicine

## 2021-07-15 DIAGNOSIS — D6869 Other thrombophilia: Secondary | ICD-10-CM | POA: Insufficient documentation

## 2021-07-15 LAB — COMPREHENSIVE METABOLIC PANEL
ALT: 29 IU/L (ref 0–44)
AST: 33 IU/L (ref 0–40)
Albumin/Globulin Ratio: 2.4 — ABNORMAL HIGH (ref 1.2–2.2)
Albumin: 4.5 g/dL (ref 3.8–4.8)
Alkaline Phosphatase: 107 IU/L (ref 44–121)
BUN/Creatinine Ratio: 24 (ref 10–24)
BUN: 20 mg/dL (ref 8–27)
Bilirubin Total: 0.6 mg/dL (ref 0.0–1.2)
CO2: 24 mmol/L (ref 20–29)
Calcium: 9.2 mg/dL (ref 8.6–10.2)
Chloride: 101 mmol/L (ref 96–106)
Creatinine, Ser: 0.82 mg/dL (ref 0.76–1.27)
Globulin, Total: 1.9 g/dL (ref 1.5–4.5)
Glucose: 86 mg/dL (ref 65–99)
Potassium: 4.2 mmol/L (ref 3.5–5.2)
Sodium: 138 mmol/L (ref 134–144)
Total Protein: 6.4 g/dL (ref 6.0–8.5)
eGFR: 96 mL/min/{1.73_m2} (ref >59)

## 2021-07-15 LAB — CBC WITH DIFFERENTIAL/PLATELET
Basophils Absolute: 0 10*3/uL (ref 0.0–0.2)
Basos: 1 %
EOS (ABSOLUTE): 0.2 10*3/uL (ref 0.0–0.4)
Eos: 4 %
Hematocrit: 37.6 % (ref 37.5–51.0)
Hemoglobin: 11.6 g/dL — ABNORMAL LOW (ref 13.0–17.7)
Immature Grans (Abs): 0 10*3/uL (ref 0.0–0.1)
Immature Granulocytes: 0 %
Lymphocytes Absolute: 0.9 10*3/uL (ref 0.7–3.1)
Lymphs: 19 %
MCH: 25.8 pg — ABNORMAL LOW (ref 26.6–33.0)
MCHC: 30.9 g/dL — ABNORMAL LOW (ref 31.5–35.7)
MCV: 84 fL (ref 79–97)
Monocytes Absolute: 0.5 10*3/uL (ref 0.1–0.9)
Monocytes: 10 %
Neutrophils Absolute: 3.2 10*3/uL (ref 1.4–7.0)
Neutrophils: 66 %
Platelets: 196 10*3/uL (ref 150–450)
RBC: 4.49 x10E6/uL (ref 4.14–5.80)
RDW: 15.4 % (ref 11.6–15.4)
WBC: 4.9 10*3/uL (ref 3.4–10.8)

## 2021-07-15 LAB — LIPID PANEL
Chol/HDL Ratio: 2.3 ratio (ref 0.0–5.0)
Cholesterol, Total: 91 mg/dL — ABNORMAL LOW (ref 100–199)
HDL: 39 mg/dL — ABNORMAL LOW (ref >39)
LDL Chol Calc (NIH): 38 mg/dL (ref 0–99)
Triglycerides: 59 mg/dL (ref 0–149)
VLDL Cholesterol Cal: 14 mg/dL (ref 5–40)

## 2021-07-15 LAB — TSH: TSH: 4.5 u[IU]/mL (ref 0.450–4.500)

## 2021-07-15 LAB — CARDIOVASCULAR RISK ASSESSMENT

## 2021-07-15 NOTE — Progress Notes (Signed)
Kidney and liver tests normal, Cholesterol good, TSH now 4.5 normal, mild anemia chronic lp

## 2021-07-15 NOTE — Chronic Care Management (AMB) (Signed)
  Chronic Care Management   Note  07/15/2021 Name: Antonio Evans MRN: [id number] DOB: Apr 11, 1951  Antonio Evans is a 70 y.o. year old male who is a primary care patient of Antonio Anes, MD. I reached out to Antonio Evans by phone today in response to a referral sent by Mr. Antonio Evans's PCP, Antonio Anes, MD.   Antonio Evans was given information about Chronic Care Management services today including:  CCM service includes personalized support from designated clinical staff supervised by his physician, including individualized plan of care and coordination with other care providers 24/7 contact phone numbers for assistance for urgent and routine care needs. Service will only be billed when office clinical staff spend 20 minutes or more in a month to coordinate care. Only one practitioner may furnish and bill the service in a calendar month. The patient may stop CCM services at any time (effective at the end of the month) by phone call to the office staff.   Patient agreed to services and verbal consent obtained.   Follow up plan:   Antonio Evans Secretary/administrator

## 2021-07-24 ENCOUNTER — Other Ambulatory Visit: Payer: Self-pay | Admitting: Legal Medicine

## 2021-08-20 DIAGNOSIS — M19079 Primary osteoarthritis, unspecified ankle and foot: Secondary | ICD-10-CM | POA: Diagnosis not present

## 2021-08-20 DIAGNOSIS — M4712 Other spondylosis with myelopathy, cervical region: Secondary | ICD-10-CM | POA: Diagnosis not present

## 2021-08-20 DIAGNOSIS — G890 Central pain syndrome: Secondary | ICD-10-CM | POA: Diagnosis not present

## 2021-08-20 DIAGNOSIS — Z79891 Long term (current) use of opiate analgesic: Secondary | ICD-10-CM | POA: Diagnosis not present

## 2021-08-21 ENCOUNTER — Telehealth: Payer: Self-pay

## 2021-08-21 NOTE — Chronic Care Management (AMB) (Signed)
Chronic Care Management Pharmacy Assistant   Name: Antonio Evans  MRN: [id number] DOB: June 22, 1951  Reason for Encounter: Chart Review for CPP visit on 08/27/2021.   Conditions to be addressed/monitored: Atrial Fibrillation, HLD, and Hypothyroidism   Recent office visits:  07/14/2021- [name], MD (PCP)- Patient presented for follow up visit on Acquired hypothyroidism, Persistent atrial fibrillation ([place]), Long term (current) use of opiate analgesic, Mixed hyperlipidemia, Other intervertebral disc degeneration, thoracic region, Morbid obesity due to excess calories ([place]), Other thrombophilia ([place]).  Prednisone 50 mg- 1 tablet daily with Breakfast for 5 days added. Ambulatory Referral to [facility] placed Return in about 6 months (around 01/14/2022) for fasting.   06/18/2021- MRI Thoracic and Cervial Spine  03/10/2021- [name], MD (PCP)- Patient presented for follow up visit on Persistent atrial fibrillation [facility]), Acquired hypothyroidism, Adolescent idiopathic scoliosis of thoracolumbar region, Long term (current) use of opiate analgesic, Mixed hyperlipidemia, Morbid obesity due to excess calories ([place]). Dicyclomine HCL 10 mg- 1 tablet 3 times daily before meals and bedtime added. Return in about 3 months (around 06/09/2021) for fasting.   Recent consult visits:  06/09/2021- [name], MD (Neurology)- Patient presented for follow up visit on Central pain syndrome, Long term (current) use of opiate analgesic, Primary osteoarthritis, unspecified ankle and foot, Other spondylosis with myelopathy, cervical region. Office visit notes not available  04/23/2021- [name], MD (Cardiology)- Patient presented for a follow up visit on Persistent atrial fibrillation [facility]), Chest pain with normal coronary angiography, Anticoagulant long-term use, Swelling, Dyslipidemia . Furosemide 20 mg- 1 tablet twice daily added Return in about 6 months (around  10/24/2021).  03/09/2021 - [name], MD (Neurology)- Patient presented for follow up visit on Central pain syndrome, Long term (current) use of opiate analgesic, Other spondylosis with myelopathy, cervical region. Office visit notes not available.   Hospital visits:  None in previous 6 months   Lab Results  Component Value Date/Time   HGBA1C 5.6 12/04/2020 03:44 PM   HGBA1C 5.2 04/01/2020 02:55 PM     BP Readings from Last 3 Encounters:  07/14/21 120/80  03/10/21 130/80  02/04/21 118/70     Medications: Outpatient Encounter Medications as of 08/21/2021  Medication Sig   apixaban (ELIQUIS) 5 MG TABS tablet Take 5 mg by mouth 2 (two) times daily.   aspirin 81 MG EC tablet Take by mouth.   aspirin 81 MG EC tablet Take by mouth.   Cholecalciferol 125 MCG (5000 UT) capsule Take by mouth.   cyanocobalamin 1000 MCG tablet Take by mouth.   diclofenac Sodium (VOLTAREN) 1 % GEL SMARTSIG:Gram(s) Topical Twice Daily   dicyclomine (BENTYL) 10 MG capsule TAKE 1 CAPSULE (10 MG TOTAL) BY MOUTH 4 (FOUR) TIMES DAILY -  BEFORE MEALS AND AT BEDTIME.   famotidine (PEPCID) 40 MG tablet Take 1 tablet (40 mg total) by mouth 2 (two) times daily.   furosemide (LASIX) 20 MG tablet Take 1 tablet (20 mg total) by mouth daily.   hydrOXYzine (VISTARIL) 25 MG capsule Take 25 mg by mouth 3 (three) times daily.   levothyroxine (SYNTHROID) 175 MCG tablet Take 1 tablet (175 mcg total) by mouth daily.   metolazone (ZAROXOLYN) 5 MG tablet    metoprolol tartrate (LOPRESSOR) 25 MG tablet Take 1 tablet (25 mg total) by mouth 2 (two) times daily.   montelukast (SINGULAIR) 10 MG tablet Take 1 tablet (10 mg total) by mouth every evening.   morphine (MS CONTIN) 60 MG 12 hr tablet SMARTSIG:1 Tablet(s) By  Mouth Every 12 Hours   Multiple Vitamin (MULTI-VITAMIN) tablet Take by mouth.   Oxycodone HCl 20 MG TABS    potassium chloride SA (KLOR-CON) 20 MEQ tablet Take 1 tablet (20 mEq total) by mouth 2 (two) times daily  with a meal.   pravastatin (PRAVACHOL) 40 MG tablet Take 1 tablet (40 mg total) by mouth 2 (two) times daily.   predniSONE (DELTASONE) 50 MG tablet TAKE 1 TABLET EVERY DAY WITH BREAKFAST   Skin Protectants, Misc. (EUCERIN) cream Apply topically as needed for dry skin.   tiZANidine (ZANAFLEX) 4 MG tablet Take 1 tablet (4 mg total) by mouth 3 (three) times daily.   torsemide (DEMADEX) 20 MG tablet Take by mouth.   No facility-administered encounter medications on file as of 08/21/2021.    Care Gaps: Hepatitis C Screening- Overdue - never done  COLONOSCOPY (Pts 45-49yrs Insurance coverage will need to be confirmed) (Every 10 Years) - Due Zoster Vaccines- Shingrix (1 of 2)- Due Jun 29, 2021 INFLUENZA VACCINE (Every 8 Months, August to March)- Last completed: Jul 31, 2020  Star Rating Drugs: Pravastatin 40 mg- Last filled 06/08/2021 for 90 DS   [name], [place] Pharmacist Assistant [phone]

## 2021-08-25 ENCOUNTER — Ambulatory Visit: Payer: Medicare HMO

## 2021-08-27 ENCOUNTER — Ambulatory Visit (INDEPENDENT_AMBULATORY_CARE_PROVIDER_SITE_OTHER): Payer: Medicare HMO

## 2021-08-27 ENCOUNTER — Other Ambulatory Visit: Payer: Self-pay

## 2021-08-27 DIAGNOSIS — G894 Chronic pain syndrome: Secondary | ICD-10-CM

## 2021-08-27 DIAGNOSIS — E039 Hypothyroidism, unspecified: Secondary | ICD-10-CM

## 2021-08-27 DIAGNOSIS — I4819 Other persistent atrial fibrillation: Secondary | ICD-10-CM

## 2021-08-27 DIAGNOSIS — E782 Mixed hyperlipidemia: Secondary | ICD-10-CM

## 2021-08-27 NOTE — Patient Instructions (Signed)
Visit Information   Goals Addressed             This Visit's Progress    Manage My Medicine       Timeframe:  Long-Range Goal Priority:  High Start Date:                             Expected End Date:                       Follow Up Date 08/27/22   - call for medicine refill 2 or 3 days before it runs out - keep a list of all the medicines I take; vitamins and herbals too    Why is this important?   These steps will help you keep on track with your medicines.   Notes:      Track and Manage My Blood Pressure-Hypertension       Timeframe:  Long-Range Goal Priority:  High Start Date:                             Expected End Date:                       Follow Up Date 08/27/22   - choose a place to take my blood pressure (home, clinic or office, retail store) - write blood pressure results in a log or diary    Why is this important?   You won't feel high blood pressure, but it can still hurt your blood vessels.  High blood pressure can cause heart or kidney problems. It can also cause a stroke.  Making lifestyle changes like losing a little weight or eating less salt will help.  Checking your blood pressure at home and at different times of the day can help to control blood pressure.  If the doctor prescribes medicine remember to take it the way the doctor ordered.  Call the office if you cannot afford the medicine or if there are questions about it.     Notes:        Patient Care Plan: CCM Pharmacy Care Plan     Problem Identified: Cardio, Thyroid, Pain   Priority: High  Onset Date: 08/27/2021     Long-Range Goal: Disease State Management   Start Date: 08/27/2021  Expected End Date: 08/27/2022  This Visit's Progress: On track  Priority: High  Note:   Current Barriers:  Unable to independently afford treatment regimen  Pharmacist Clinical Goal(s):  Patient will contact provider office for questions/concerns as evidenced notation of same in electronic health  record through collaboration with PharmD and provider.   Interventions: 1:1 collaboration with Lillard Anes, MD regarding development and update of comprehensive plan of care as evidenced by provider attestation and co-signature Inter-disciplinary care team collaboration (see longitudinal plan of care) Comprehensive medication review performed; medication list updated in electronic medical record  Atrial Fibrillation (Goal: prevent stroke and major bleeding) -Controlled -Current treatment: Rate control:  Metoprolol 25mg  BID Furosemide 20mg  Torsemide 20mg  Metolazone 5mg  Kcl 80mEq Anticoagulation: Eliquis 5mg  -Medications previously tried: N/A -Home BP and HR readings: Unknown   -Counseled on increased risk of stroke due to Afib and benefits of anticoagulation for stroke prevention; September 2022: Patient was on the way out the door, unable to speak about most of his meds. Main priority he stated was that he  needs help affording Eliquis, will start PAP  Patient Goals/Self-Care Activities Patient will:  - collaborate with provider on medication access solutions  Follow Up Plan: The patient has been provided with contact information for the care management team and has been advised to call with any health related questions or concerns.       Mr. Mathers was given information about Chronic Care Management services today including:  CCM service includes personalized support from designated clinical staff supervised by his physician, including individualized plan of care and coordination with other care providers 24/7 contact phone numbers for assistance for urgent and routine care needs. Standard insurance, coinsurance, copays and deductibles apply for chronic care management only during months in which we provide at least 20 minutes of these services. Most insurances cover these services at 100%, however patients may be responsible for any copay, coinsurance and/or deductible  if applicable. This service may help you avoid the need for more expensive face-to-face services. Only one practitioner may furnish and bill the service in a calendar month. The patient may stop CCM services at any time (effective at the end of the month) by phone call to the office staff.  Patient agreed to services and verbal consent obtained.   The patient verbalized understanding of instructions, educational materials, and care plan provided today and declined offer to receive copy of patient instructions, educational materials, and care plan.  The pharmacy team will reach out to the patient again over the next 90 days.   Lane Hacker, [facility]

## 2021-08-27 NOTE — Progress Notes (Signed)
Chronic Care Management Pharmacy Note  08/27/2021 Name:  Antonio Evans MRN:  [id number] DOB:  03-11-1951  Summary: -Unable to conduct full visit, patient was headed out the door. We had enough time for him to tell me he can't afford Eliquis  Recommendations/Changes made from today's visit: -N/A  Plan: -Eliquis PAP   Subjective: Antonio Evans is an 70 y.o. year old male who is a primary patient of Henrene Pastor, Zeb Comfort, MD.  The CCM team was consulted for assistance with disease management and care coordination needs.    Engaged with patient by telephone for initial visit in response to provider referral for pharmacy case management and/or care coordination services.   Consent to Services:  The patient was given the following information about Chronic Care Management services today, agreed to services, and gave verbal consent: 1. CCM service includes personalized support from designated clinical staff supervised by the primary care provider, including individualized plan of care and coordination with other care providers 2. 24/7 contact phone numbers for assistance for urgent and routine care needs. 3. Service will only be billed when office clinical staff spend 20 minutes or more in a month to coordinate care. 4. Only one practitioner may furnish and bill the service in a calendar month. 5.The patient may stop CCM services at any time (effective at the end of the month) by phone call to the office staff. 6. The patient will be responsible for cost sharing (co-pay) of up to 20% of the service fee (after annual deductible is met). Patient agreed to services and consent obtained.  Patient Care Team: Lillard Anes, MD as PCP - General (Family Medicine) Burnice Logan, [facility] (Inactive) as Pharmacist (Pharmacist)  Recent office visits:  07/14/2021- Antonio Meeker, MD (PCP)- Patient presented for follow up visit on Acquired hypothyroidism, Persistent atrial fibrillation ([place]), Long term  (current) use of opiate analgesic, Mixed hyperlipidemia, Other intervertebral disc degeneration, thoracic region, Morbid obesity due to excess calories ([place]), Other thrombophilia ([place]).  Prednisone 50 mg- 1 tablet daily with Breakfast for 5 days added. Ambulatory Referral to [place] placed Return in about 6 months (around 01/14/2022) for fasting.    06/18/2021- MRI Thoracic and Cervial Spine   03/10/2021- Antonio Meeker, MD (PCP)- Patient presented for follow up visit on Persistent atrial fibrillation [facility]), Acquired hypothyroidism, Adolescent idiopathic scoliosis of thoracolumbar region, Long term (current) use of opiate analgesic, Mixed hyperlipidemia, Morbid obesity due to excess calories ([place]). Dicyclomine HCL 10 mg- 1 tablet 3 times daily before meals and bedtime added. Return in about 3 months (around 06/09/2021) for fasting.     Recent consult visits:  06/09/2021- Antonio Bjornstad, MD (Neurology)- Patient presented for follow up visit on Central pain syndrome, Long term (current) use of opiate analgesic, Primary osteoarthritis, unspecified ankle and foot, Other spondylosis with myelopathy, cervical region. Office visit notes not available   04/23/2021- Antonio Richard, MD (Cardiology)- Patient presented for a follow up visit on Persistent atrial fibrillation [facility]), Chest pain with normal coronary angiography, Anticoagulant long-term use, Swelling, Dyslipidemia . Furosemide 20 mg- 1 tablet twice daily added Return in about 6 months (around 10/24/2021).   03/09/2021 - Antonio Bjornstad, MD (Neurology)- Patient presented for follow up visit on Central pain syndrome, Long term (current) use of opiate analgesic, Other spondylosis with myelopathy, cervical region. Office visit notes not available.     Hospital visits:  None in previous 6 months   Objective:  Lab Results  Component Value Date   CREATININE 0.82 07/14/2021  BUN 20 07/14/2021   GFRNONAA 85 12/04/2020   GFRAA 98  12/04/2020   NA 138 07/14/2021   K 4.2 07/14/2021   CALCIUM 9.2 07/14/2021   CO2 24 07/14/2021   GLUCOSE 86 07/14/2021    Lab Results  Component Value Date/Time   HGBA1C 5.6 12/04/2020 03:44 PM   HGBA1C 5.2 04/01/2020 02:55 PM    Last diabetic Eye exam: No results found for: HMDIABEYEEXA  Last diabetic Foot exam: No results found for: HMDIABFOOTEX   Lab Results  Component Value Date   CHOL 91 (L) 07/14/2021   HDL 39 (L) 07/14/2021   LDLCALC 38 07/14/2021   TRIG 59 07/14/2021   CHOLHDL 2.3 07/14/2021    Hepatic Function Latest Ref Rng & Units 07/14/2021 03/10/2021 01/28/2021  Total Protein 6.0 - 8.5 g/dL 6.4 6.9 6.6  Albumin 3.8 - 4.8 g/dL 4.5 4.5 4.4  AST 0 - 40 IU/L 33 25 31  ALT 0 - 44 IU/L _0 Alk Phosphatase 44 - 121 IU/L 107 102 99  Total Bilirubin 0.0 - 1.2 mg/dL 0.6 0.6 0.5    Lab Results  Component Value Date/Time   TSH 4.500 07/14/2021 02:34 PM   TSH 7.190 (H) 03/10/2021 03:57 PM    CBC Latest Ref Rng & Units 07/14/2021 03/10/2021 01/28/2021  WBC 3.4 - 10.8 x10E3/uL 4.9 4.9 6.7  Hemoglobin 13.0 - 17.7 g/dL 11.6(L) 11.8(L) 11.7(L)  Hematocrit 37.5 - 51.0 % 37.6 37.4(L) 37.7  Platelets 150 - 450 x10E3/uL 196 199 239    No results found for: VD25OH  Clinical ASCVD: No  The ASCVD Risk score (Arnett DK, et al., 2019) failed to calculate for the following reasons:   The valid total cholesterol range is 130 to 320 mg/dL    Depression screen [facility] 2/9 07/31/2020 07/31/2020  Decreased Interest 1 0  Down, Depressed, Hopeless 1 0  PHQ - 2 Score 2 0  Altered sleeping 3 -  Tired, decreased energy 1 -  Change in appetite 1 -  Feeling bad or failure about yourself  0 -  Trouble concentrating 0 -  Moving slowly or fidgety/restless 0 -  Suicidal thoughts 0 -  PHQ-9 Score 7 -  Difficult doing work/chores Not difficult at all -     Other: (CHADS2VASc if Afib, MMRC or CAT for COPD, ACT, DEXA)  Social History   Tobacco Use  Smoking Status Former   Types:  Cigarettes   Quit date: 1984   Years since quitting: 38.7  Smokeless Tobacco Never   BP Readings from Last 3 Encounters:  07/14/21 120/80  03/10/21 130/80  02/04/21 118/70   Pulse Readings from Last 3 Encounters:  07/14/21 71  03/10/21 66  02/04/21 79   Wt Readings from Last 3 Encounters:  07/14/21 291 lb (132 kg)  03/10/21 (!) 301 lb (136.5 kg)  02/04/21 (!) 305 lb (138.3 kg)   BMI Readings from Last 3 Encounters:  07/14/21 40.59 kg/m  03/10/21 41.98 kg/m  02/04/21 42.54 kg/m    Assessment/Interventions: Review of patient past medical history, allergies, medications, health status, including review of consultants reports, laboratory and other test data, was performed as part of comprehensive evaluation and provision of chronic care management services.   SDOH:  (Social Determinants of Health) assessments and interventions performed: Yes SDOH Interventions    Flowsheet Row Most Recent Value  SDOH Interventions   Financial Strain Interventions Other (Comment)  [Eliquis PAP]      SDOH Screenings   Alcohol Screen:  Not on file  Depression (PHQ2-9): Not on file  Financial Resource Strain: High Risk   Difficulty of Paying Living Expenses: Hard  Food Insecurity: Not on file  Housing: Not on file  Physical Activity: Not on file  Social Connections: Not on file  Stress: Not on file  Tobacco Use: Medium Risk   Smoking Tobacco Use: Former   Smokeless Tobacco Use: Never  Transportation Needs: Not on file    CCM Care Plan  Allergies  Allergen Reactions   Bee Pollen Shortness Of Breath and Swelling   Enoxaparin Other (See Comments)    Other reaction(s): Bleeding (intolerance)  Other reaction(s): Bleeding (intolerance) Other reaction(s): Bleeding (intolerance)    Pollen Extract Shortness Of Breath and Swelling   Gabapentin Other (See Comments)    GI upset Other reaction(s): Other (See Comments) GI upset  GI upset Other reaction(s): Other (See Comments) GI  upset Other reaction(s): Other (See Comments) GI upset GI upset    Levofloxacin Rash   Sulfamethoxazole-Trimethoprim Other (See Comments)    UNK   Sulfamethoxazole Other (See Comments)    Other reaction(s): Other (See Comments)     Medications Reviewed Today     Reviewed by Lillard Anes, MD (Physician) on 07/14/21 at 1413  Med List Status: <None>   Medication Order Taking? Sig Documenting Provider Last Dose Status Informant  apixaban (ELIQUIS) 5 MG TABS tablet [id number] Yes Take 5 mg by mouth 2 (two) times daily. [provider] Taking Active   aspirin 81 MG EC tablet [id number] Yes Take by mouth. [provider] Taking Active   aspirin 81 MG EC tablet [id number] Yes Take by mouth. [provider] Taking Active   Cholecalciferol 125 MCG (5000 UT) capsule [id number] Yes Take by mouth. [provider] Taking Active   cyanocobalamin 1000 MCG tablet [id number] Yes Take by mouth. [provider] Taking Active   diclofenac Sodium (VOLTAREN) 1 % GEL [id number] Yes SMARTSIG:Gram(s) Topical Twice Daily [provider] Taking Active   dicyclomine (BENTYL) 10 MG capsule [id number] Yes TAKE 1 CAPSULE (10 MG TOTAL) BY MOUTH 4 (FOUR) TIMES DAILY -  BEFORE MEALS AND AT BEDTIME. Lillard Anes, MD Taking Active   famotidine (PEPCID) 40 MG tablet [id number] Yes Take 1 tablet (40 mg total) by mouth 2 (two) times daily. Lillard Anes, MD Taking Active   furosemide (LASIX) 20 MG tablet [id number] Yes Take 1 tablet (20 mg total) by mouth daily. Lillard Anes, MD Taking Active   hydrOXYzine (VISTARIL) 25 MG capsule [id number] Yes Take 25 mg by mouth 3 (three) times daily. [provider] Taking Active   levothyroxine (SYNTHROID) 175 MCG tablet [id number] Yes Take 1 tablet (175 mcg total) by mouth daily. Lillard Anes, MD Taking Active   metolazone (ZAROXOLYN) 5 MG tablet [id number] Yes  [provider] Taking  Active   metoprolol tartrate (LOPRESSOR) 25 MG tablet [id number] Yes Take 1 tablet (25 mg total) by mouth 2 (two) times daily. Lillard Anes, MD Taking Active   montelukast (SINGULAIR) 10 MG tablet [id number] Yes Take 1 tablet (10 mg total) by mouth every evening. Lillard Anes, MD Taking Active   morphine (MS CONTIN) 60 MG 12 hr tablet [id number] Yes SMARTSIG:1 Tablet(s) By Mouth Every 12 Hours [provider] Taking Active   Multiple Vitamin (MULTI-VITAMIN) tablet [id number] Yes Take by mouth. [provider] Taking Active   Oxycodone HCl 20 MG TABS [id number] Yes  [provider] Taking Active  potassium chloride SA (KLOR-CON) 20 MEQ tablet [id number] Yes Take 1 tablet (20 mEq total) by mouth 2 (two) times daily with a meal. Lillard Anes, MD Taking Active   pravastatin (PRAVACHOL) 40 MG tablet [id number] Yes Take 1 tablet (40 mg total) by mouth 2 (two) times daily. Lillard Anes, MD Taking Active   Skin Protectants, Misc. (EUCERIN) cream [id number] Yes Apply topically as needed for dry skin. Lillard Anes, MD Taking Active   tiZANidine (ZANAFLEX) 4 MG tablet [id number] Yes Take 1 tablet (4 mg total) by mouth 3 (three) times daily. Lillard Anes, MD Taking Active   torsemide [facility]) 20 MG tablet [id number] Yes Take by mouth. [provider] Taking Active             Patient Active Problem List   Diagnosis Date Noted   Other thrombophilia ([place]) 07/15/2021   Cervical spinal stenosis 05/27/2021   Thoracic spinal stenosis 05/27/2021   Chest pain 02/04/2021   Persistent atrial fibrillation ([place]) 09/02/2020   Back pain 04/08/2020   BMI 40.0-44.9, adult ([place]) 04/01/2020   Mass of left forearm 04/01/2020   Hyperglycemia    Urge incontinence    Mixed hyperlipidemia    Hypothyroidism    Morbid obesity due to excess calories ([place])    Long term (current) use of opiate analgesic    Varicose veins of both legs with  edema 03/30/2018   Venous stasis dermatitis of both lower extremities 03/30/2018   Chronic pain 07/28/2012   Dyslipidemia 07/28/2012   H/O gastric bypass 07/28/2012   Scoliosis 07/28/2012   Unspecified osteoarthritis, unspecified site 07/28/2012   Unsteady gait 07/28/2012    Immunization History  Administered Date(s) Administered   Fluad Quad(high Dose 65+) 07/31/2020   Influenza,inj,Quad PF,6+ Mos 09/05/2013, 09/19/2014, 09/24/2015, 09/27/2016, 09/28/2017   Moderna Sars-Covid-2 Vaccination 01/31/2020, 02/28/2020, 11/17/2020, 05/27/2021   Tdap 09/24/2015    Conditions to be addressed/monitored:  Hypertension, Hyperlipidemia, and Atrial Fibrillation  Care Plan : [place]  Updates made by Lane Hacker, [place] since 08/27/2021 12:00 AM     Problem: Cardio, Thyroid, Pain   Priority: High  Onset Date: 08/27/2021     Goal: Patient-Specific Goal        Medication Assistance: Application for Eliquis  medication assistance program. in process.  Anticipated assistance start date Hopefully will start October.  See plan of care for additional detail.  Compliance/Adherence/Medication fill history: Care Gaps: Hepatitis C Screening- Overdue - never done  COLONOSCOPY (Pts 45-76yr Insurance coverage will need to be confirmed) (Every 10 Years)       - Due Zoster Vaccines- Shingrix (1 of 2)- Due Jun 29, 2021 INFLUENZA VACCINE (Every 8 Months, August to March)- Last completed: Jul 31, 2020   Star Rating Drugs: Pravastatin 40 mg- Last filled 06/08/2021 for 90 DS  [facility]  [facility] [place] [place]Phone: [phone]: [phone] [address]- [address] NM[facility] HospitalOF [address] [place] [place] SpringsRRumaTSawtooth Behavioral HealthNAlaska[address]Phone: [phone]: [phone]  Care Plan and Follow Up Patient Decision:  Patient  agrees to Care Plan and Follow-up.  Plan: The patient has been provided with contact information for the care management team and has been advised to call with any health related questions or concerns.   NArizona Constable Pharm.D. -- [phone]

## 2021-08-28 DIAGNOSIS — E782 Mixed hyperlipidemia: Secondary | ICD-10-CM

## 2021-08-28 DIAGNOSIS — I4819 Other persistent atrial fibrillation: Secondary | ICD-10-CM

## 2021-08-28 DIAGNOSIS — E039 Hypothyroidism, unspecified: Secondary | ICD-10-CM | POA: Diagnosis not present

## 2021-09-02 ENCOUNTER — Telehealth: Payer: Self-pay

## 2021-09-02 NOTE — Chronic Care Management (AMB) (Signed)
    Chronic Care Management Pharmacy Assistant   Name: Antonio Evans  MRN: [id number] DOB: 04/25/1951  Reason for Encounter: Patient Assistance Coordination  09/02/2021- Patient assistance application filled out for Antonio Evans with Antonio Evans patient assistance program. Called patient to inform I will place application in the mail, spoke with patient he is aware that income documentation and out of pocket expense report from his pharmacy is needed with application and to take to Antonio Evans office (Cardiology) for him to sign and fax. Patient aware and agrees with plan.   Medications: Outpatient Encounter Medications as of 09/02/2021  Medication Sig   apixaban (Antonio Evans) 5 MG TABS tablet Take 5 mg by mouth 2 (two) times daily.   aspirin 81 MG EC tablet Take by mouth.   aspirin 81 MG EC tablet Take by mouth.   Cholecalciferol 125 MCG (5000 UT) capsule Take by mouth.   cyanocobalamin 1000 MCG tablet Take by mouth.   diclofenac Sodium (VOLTAREN) 1 % GEL SMARTSIG:Gram(s) Topical Twice Daily   dicyclomine (BENTYL) 10 MG capsule TAKE 1 CAPSULE (10 MG TOTAL) BY MOUTH 4 (FOUR) TIMES DAILY -  BEFORE MEALS AND AT BEDTIME.   famotidine (PEPCID) 40 MG tablet Take 1 tablet (40 mg total) by mouth 2 (two) times daily.   furosemide (LASIX) 20 MG tablet Take 1 tablet (20 mg total) by mouth daily.   hydrOXYzine (VISTARIL) 25 MG capsule Take 25 mg by mouth 3 (three) times daily.   levothyroxine (SYNTHROID) 175 MCG tablet Take 1 tablet (175 mcg total) by mouth daily.   metolazone (ZAROXOLYN) 5 MG tablet    metoprolol tartrate (LOPRESSOR) 25 MG tablet Take 1 tablet (25 mg total) by mouth 2 (two) times daily.   montelukast (SINGULAIR) 10 MG tablet Take 1 tablet (10 mg total) by mouth every evening.   morphine (MS CONTIN) 60 MG 12 hr tablet SMARTSIG:1 Tablet(s) By Mouth Every 12 Hours   Multiple Vitamin (MULTI-VITAMIN) tablet Take by mouth.   Oxycodone HCl 20 MG TABS    potassium chloride SA (KLOR-CON) 20  MEQ tablet Take 1 tablet (20 mEq total) by mouth 2 (two) times daily with a meal.   pravastatin (PRAVACHOL) 40 MG tablet Take 1 tablet (40 mg total) by mouth 2 (two) times daily.   predniSONE (DELTASONE) 50 MG tablet TAKE 1 TABLET EVERY DAY WITH BREAKFAST   Skin Protectants, Misc. (EUCERIN) cream Apply topically as needed for dry skin.   tiZANidine (ZANAFLEX) 4 MG tablet Take 1 tablet (4 mg total) by mouth 3 (three) times daily.   torsemide (DEMADEX) 20 MG tablet Take by mouth.   No facility-administered encounter medications on file as of 09/02/2021.   Pattricia Evans, [place] Pharmacist Assistant [phone]

## 2021-09-16 DIAGNOSIS — G890 Central pain syndrome: Secondary | ICD-10-CM | POA: Diagnosis not present

## 2021-09-16 DIAGNOSIS — M19079 Primary osteoarthritis, unspecified ankle and foot: Secondary | ICD-10-CM | POA: Diagnosis not present

## 2021-09-16 DIAGNOSIS — M4712 Other spondylosis with myelopathy, cervical region: Secondary | ICD-10-CM | POA: Diagnosis not present

## 2021-09-16 DIAGNOSIS — Z79891 Long term (current) use of opiate analgesic: Secondary | ICD-10-CM | POA: Diagnosis not present

## 2021-10-09 ENCOUNTER — Telehealth: Payer: Self-pay

## 2021-10-09 NOTE — Progress Notes (Signed)
Chronic Care Management Pharmacy Assistant   Name: Naod Sweetland  MRN: [id number] DOB: 1951/03/29   Reason for Encounter: Disease State call for HTN     Recent office visits:  None   Recent consult visits:  None   Hospital visits:  None   Medications: Outpatient Encounter Medications as of 10/09/2021  Medication Sig   apixaban (ELIQUIS) 5 MG TABS tablet Take 5 mg by mouth 2 (two) times daily.   aspirin 81 MG EC tablet Take by mouth.   aspirin 81 MG EC tablet Take by mouth.   Cholecalciferol 125 MCG (5000 UT) capsule Take by mouth.   cyanocobalamin 1000 MCG tablet Take by mouth.   diclofenac Sodium (VOLTAREN) 1 % GEL SMARTSIG:Gram(s) Topical Twice Daily   dicyclomine (BENTYL) 10 MG capsule TAKE 1 CAPSULE (10 MG TOTAL) BY MOUTH 4 (FOUR) TIMES DAILY -  BEFORE MEALS AND AT BEDTIME.   famotidine (PEPCID) 40 MG tablet Take 1 tablet (40 mg total) by mouth 2 (two) times daily.   furosemide (LASIX) 20 MG tablet Take 1 tablet (20 mg total) by mouth daily.   hydrOXYzine (VISTARIL) 25 MG capsule Take 25 mg by mouth 3 (three) times daily.   levothyroxine (SYNTHROID) 175 MCG tablet Take 1 tablet (175 mcg total) by mouth daily.   metolazone (ZAROXOLYN) 5 MG tablet    metoprolol tartrate (LOPRESSOR) 25 MG tablet Take 1 tablet (25 mg total) by mouth 2 (two) times daily.   montelukast (SINGULAIR) 10 MG tablet Take 1 tablet (10 mg total) by mouth every evening.   morphine (MS CONTIN) 60 MG 12 hr tablet SMARTSIG:1 Tablet(s) By Mouth Every 12 Hours   Multiple Vitamin (MULTI-VITAMIN) tablet Take by mouth.   Oxycodone HCl 20 MG TABS    potassium chloride SA (KLOR-CON) 20 MEQ tablet Take 1 tablet (20 mEq total) by mouth 2 (two) times daily with a meal.   pravastatin (PRAVACHOL) 40 MG tablet Take 1 tablet (40 mg total) by mouth 2 (two) times daily.   predniSONE (DELTASONE) 50 MG tablet TAKE 1 TABLET EVERY DAY WITH BREAKFAST   Skin Protectants, Misc. (EUCERIN) cream Apply topically as needed for  dry skin.   tiZANidine (ZANAFLEX) 4 MG tablet Take 1 tablet (4 mg total) by mouth 3 (three) times daily.   torsemide (DEMADEX) 20 MG tablet Take by mouth.   No facility-administered encounter medications on file as of 10/09/2021.     Recent Office Vitals: BP Readings from Last 3 Encounters:  07/14/21 120/80  03/10/21 130/80  02/04/21 118/70   Pulse Readings from Last 3 Encounters:  07/14/21 71  03/10/21 66  02/04/21 79    Wt Readings from Last 3 Encounters:  07/14/21 291 lb (132 kg)  03/10/21 (!) 301 lb (136.5 kg)  02/04/21 (!) 305 lb (138.3 kg)     Kidney Function Lab Results  Component Value Date/Time   CREATININE 0.82 07/14/2021 02:34 PM   CREATININE 0.94 03/10/2021 03:57 PM   GFRNONAA 85 12/04/2020 03:44 PM   GFRAA 98 12/04/2020 03:44 PM    BMP Latest Ref Rng & Units 07/14/2021 03/10/2021 01/28/2021  Glucose 65 - 99 mg/dL 86 80 82  BUN 8 - 27 mg/dL 20 16 24   Creatinine 0.76 - 1.27 mg/dL 0.82 0.94 1.01  BUN/Creat Ratio 10 - 24 24 17 24   Sodium 134 - 144 mmol/L 138 141 144  Potassium 3.5 - 5.2 mmol/L 4.2 4.6 4.2  Chloride 96 - 106 mmol/L 101 103 104  CO2 20 -  29 mmol/L 24 22 24   Calcium 8.6 - 10.2 mg/dL 9.2 9.5 9.2     Current antihypertensive regimen:  Metoprolol 25 mg  2 times daily Furosemide 20 mg daily   Patient verbally confirms he is taking the above medications as directed. Yes  How often are you checking your Blood Pressure? infrequently Pt stated he has not checked recently and does not have any readings but states they stay pretty good. Ranges 120-130/60   Wrist or arm cuff: Arm  Caffeine intake: Pt drinks coffee daily Salt intake:Limited  OTC medications including pseudoephedrine or NSAIDs? Pt only takes an aspirin   Any readings above 180/120? No   What recent interventions/DTPs have been made by any provider to improve Blood Pressure control since last CPP Visit: Pt stated no changes   Any recent hospitalizations or ED visits since last  visit with CPP? No  What diet changes have been made to improve Blood Pressure Control?  Pt stated no changes   What exercise is being done to improve your Blood Pressure Control? Pt has a lot of fluid build up in his legs and stated since his chair broke down his legs have been in pain  Pt stated his stair lift chair is broken and needs repaired and wheelchair stated he needs a new one and wanted to ask if we can send in an order to have these filled through a local DME. He stated his wheelchair is about 68 years old. Pt stated he called a repair place for his stair lift and they are charging him $200 just for a service call and needs help asap.   Adherence Review: Is the patient currently on ACE/ARB medication? No Does the patient have >5 day gap between last estimated fill dates? No Metoprolol 07/10/21 90ds Furosemide 07/10/21 90ds  Care Gaps: Last annual wellness visit? None noted   Star Rating Drugs:  Medication:  Last Fill: Day Supply None  Elray Mcgregor, [place] Pharmacist Assistant  [phone]

## 2021-10-13 NOTE — Telephone Encounter (Signed)
Coordinated with nursing staff to help regarding wheelchair if possible

## 2021-11-12 DIAGNOSIS — R609 Edema, unspecified: Secondary | ICD-10-CM | POA: Diagnosis not present

## 2021-11-12 DIAGNOSIS — R079 Chest pain, unspecified: Secondary | ICD-10-CM | POA: Diagnosis not present

## 2021-11-12 DIAGNOSIS — M79605 Pain in left leg: Secondary | ICD-10-CM | POA: Diagnosis not present

## 2021-11-12 DIAGNOSIS — M79604 Pain in right leg: Secondary | ICD-10-CM | POA: Insufficient documentation

## 2021-11-12 DIAGNOSIS — I4819 Other persistent atrial fibrillation: Secondary | ICD-10-CM | POA: Diagnosis not present

## 2021-11-12 DIAGNOSIS — E785 Hyperlipidemia, unspecified: Secondary | ICD-10-CM | POA: Diagnosis not present

## 2021-11-12 DIAGNOSIS — Z7901 Long term (current) use of anticoagulants: Secondary | ICD-10-CM | POA: Diagnosis not present

## 2021-12-10 ENCOUNTER — Telehealth: Payer: Self-pay

## 2021-12-10 NOTE — Telephone Encounter (Signed)
Patient left a message saying that he goes a pain doctor in [place], [place] who tells him that he must be see his PCP 12 times. I called him to clarify the message.

## 2021-12-10 NOTE — Chronic Care Management (AMB) (Signed)
Chronic Care Management Pharmacy Assistant   Name: Antonio Evans  MRN: [id number] DOB: [birth year]   Reason for Encounter: Disease State call for HTN    Recent office visits:  None  Recent consult visits:  11/12/21 (Cardiology) Mar Daring MD. Seen for [facility]ria Fibrillation. No med changes.   Hospital visits:  None   Medications: Outpatient Encounter Medications as of 12/10/2021  Medication Sig   apixaban (ELIQUIS) 5 MG TABS tablet Take 5 mg by mouth 2 (two) times daily.   aspirin 81 MG EC tablet Take by mouth.   aspirin 81 MG EC tablet Take by mouth.   Cholecalciferol 125 MCG (5000 UT) capsule Take by mouth.   cyanocobalamin 1000 MCG tablet Take by mouth.   diclofenac Sodium (VOLTAREN) 1 % GEL SMARTSIG:Gram(s) Topical Twice Daily   dicyclomine (BENTYL) 10 MG capsule TAKE 1 CAPSULE (10 MG TOTAL) BY MOUTH 4 (FOUR) TIMES DAILY -  BEFORE MEALS AND AT BEDTIME.   famotidine (PEPCID) 40 MG tablet Take 1 tablet (40 mg total) by mouth 2 (two) times daily.   furosemide (LASIX) 20 MG tablet Take 1 tablet (20 mg total) by mouth daily.   hydrOXYzine (VISTARIL) 25 MG capsule Take 25 mg by mouth 3 (three) times daily.   levothyroxine (SYNTHROID) 175 MCG tablet Take 1 tablet (175 mcg total) by mouth daily.   metolazone (ZAROXOLYN) 5 MG tablet    metoprolol tartrate (LOPRESSOR) 25 MG tablet Take 1 tablet (25 mg total) by mouth 2 (two) times daily.   montelukast (SINGULAIR) 10 MG tablet Take 1 tablet (10 mg total) by mouth every evening.   morphine (MS CONTIN) 60 MG 12 hr tablet SMARTSIG:1 Tablet(s) By Mouth Every 12 Hours   Multiple Vitamin (MULTI-VITAMIN) tablet Take by mouth.   Oxycodone HCl 20 MG TABS    potassium chloride SA (KLOR-CON) 20 MEQ tablet Take 1 tablet (20 mEq total) by mouth 2 (two) times daily with a meal.   pravastatin (PRAVACHOL) 40 MG tablet Take 1 tablet (40 mg total) by mouth 2 (two) times daily.   predniSONE (DELTASONE) 50 MG tablet TAKE 1 TABLET EVERY DAY WITH  BREAKFAST   Skin Protectants, Misc. (EUCERIN) cream Apply topically as needed for dry skin.   tiZANidine (ZANAFLEX) 4 MG tablet Take 1 tablet (4 mg total) by mouth 3 (three) times daily.   torsemide (DEMADEX) 20 MG tablet Take by mouth.   No facility-administered encounter medications on file as of 12/10/2021.    Recent Office Vitals: BP Readings from Last 3 Encounters:  07/14/21 120/80  03/10/21 130/80  02/04/21 118/70   Pulse Readings from Last 3 Encounters:  07/14/21 71  03/10/21 66  02/04/21 79    Wt Readings from Last 3 Encounters:  07/14/21 291 lb (132 kg)  03/10/21 (!) 301 lb (136.5 kg)  02/04/21 (!) 305 lb (138.3 kg)     Kidney Function Lab Results  Component Value Date/Time   CREATININE 0.82 07/14/2021 02:34 PM   CREATININE 0.94 03/10/2021 03:57 PM   GFRNONAA 85 12/04/2020 03:44 PM   GFRAA 98 12/04/2020 03:44 PM    BMP Latest Ref Rng & Units 07/14/2021 03/10/2021 01/28/2021  Glucose 65 - 99 mg/dL 86 80 82  BUN 8 - 27 mg/dL 20 16 24   Creatinine 0.76 - 1.27 mg/dL 0.82 0.94 1.01  BUN/Creat Ratio 10 - 24 24 17 24   Sodium 134 - 144 mmol/L 138 141 144  Potassium 3.5 - 5.2 mmol/L 4.2 4.6 4.2  Chloride 96 -  106 mmol/L 101 103 104  CO2 20 - 29 mmol/L 24 22 24   Calcium 8.6 - 10.2 mg/dL 9.2 9.5 9.2     Current antihypertensive regimen:  Metoprolol 25 mg  2 times daily Furosemide 20 mg daily   Patient verbally confirms he is taking the above medications as directed. Yes  Current home BP readings:  Pt stated he is not checking his BP at home and he stated he has a BP Cuff but just not taking them. I advised if he can start and we will follow up with him later and check on those BP readings. Pt understood.  Wrist or arm cuff: Arm  Caffeine intake: Coffee Salt intake:Limited  OTC medications including pseudoephedrine or NSAIDs? None   Any readings above 180/120?   What recent interventions/DTPs have been made by any provider to improve Blood Pressure control since  last CPP Visit: Pt stated no changes   Any recent hospitalizations or ED visits since last visit with CPP? No  What diet changes have been made to improve Blood Pressure Control?  No changes   What exercise is being done to improve your Blood Pressure Control?  Pt still has issues with his legs and does not exercise   Adherence Review: Is the patient currently on ACE/ARB medication? Yes Does the patient have >5 day gap between last estimated fill dates? CPP to review  Care Gaps: Last annual wellness visit? None noted   Star Rating Drugs:  Medication:  Last Fill: Day Supply None noted   Elray Mcgregor, [place] Pharmacist Assistant  [phone]

## 2022-01-07 ENCOUNTER — Other Ambulatory Visit: Payer: Self-pay

## 2022-01-07 DIAGNOSIS — E038 Other specified hypothyroidism: Secondary | ICD-10-CM

## 2022-01-07 DIAGNOSIS — G894 Chronic pain syndrome: Secondary | ICD-10-CM

## 2022-01-07 DIAGNOSIS — Z9884 Bariatric surgery status: Secondary | ICD-10-CM

## 2022-01-07 DIAGNOSIS — T7840XD Allergy, unspecified, subsequent encounter: Secondary | ICD-10-CM

## 2022-01-07 DIAGNOSIS — I4819 Other persistent atrial fibrillation: Secondary | ICD-10-CM

## 2022-01-07 MED ORDER — FAMOTIDINE 40 MG PO TABS: 40.0000 mg | ORAL_TABLET | Freq: Two times a day (BID) | ORAL | 2 refills | Status: DC

## 2022-01-07 MED ORDER — POTASSIUM CHLORIDE CRYS ER 20 MEQ PO TBCR: 20.0000 meq | EXTENDED_RELEASE_TABLET | Freq: Two times a day (BID) | ORAL | 2 refills | Status: DC

## 2022-01-07 MED ORDER — LEVOTHYROXINE SODIUM 175 MCG PO TABS: 175.0000 ug | ORAL_TABLET | Freq: Every day | ORAL | 2 refills | Status: DC

## 2022-01-07 MED ORDER — HYDROXYZINE PAMOATE 25 MG PO CAPS: 25.0000 mg | ORAL_CAPSULE | Freq: Three times a day (TID) | ORAL | 0 refills | Status: DC

## 2022-01-07 MED ORDER — TIZANIDINE HCL 4 MG PO TABS: 4.0000 mg | ORAL_TABLET | Freq: Three times a day (TID) | ORAL | 2 refills | Status: DC

## 2022-01-07 MED ORDER — PRAVASTATIN SODIUM 40 MG PO TABS: 40.0000 mg | ORAL_TABLET | Freq: Two times a day (BID) | ORAL | 2 refills | Status: DC

## 2022-01-07 MED ORDER — MONTELUKAST SODIUM 10 MG PO TABS: 10.0000 mg | ORAL_TABLET | Freq: Every evening | ORAL | 2 refills | Status: DC

## 2022-01-07 NOTE — Telephone Encounter (Addendum)
Note opened in error.

## 2022-01-08 ENCOUNTER — Other Ambulatory Visit: Payer: Self-pay | Admitting: Legal Medicine

## 2022-01-08 DIAGNOSIS — E782 Mixed hyperlipidemia: Secondary | ICD-10-CM

## 2022-01-08 MED ORDER — PRAVASTATIN SODIUM 80 MG PO TABS: 40.0000 mg | ORAL_TABLET | Freq: Two times a day (BID) | ORAL | 2 refills | Status: DC

## 2022-01-13 ENCOUNTER — Telehealth: Payer: Self-pay

## 2022-01-13 NOTE — Chronic Care Management (AMB) (Signed)
Chronic Care Management Pharmacy Assistant   Name: Antonio Evans  MRN: [id number] DOB: [birth year]   Reason for Encounter: Disease State call for HTN   Recent office visits:  01/08/22 Orders Only. Perry,Lawrence MD. Increased Pravastatin from 40mg  to 80mg .   Recent consult visits:  None  Hospital visits:  None  Medications: Outpatient Encounter Medications as of 01/13/2022  Medication Sig   apixaban (ELIQUIS) 5 MG TABS tablet Take 5 mg by mouth 2 (two) times daily.   aspirin 81 MG EC tablet Take by mouth.   aspirin 81 MG EC tablet Take by mouth.   Cholecalciferol 125 MCG (5000 UT) capsule Take by mouth.   cyanocobalamin 1000 MCG tablet Take by mouth.   diclofenac Sodium (VOLTAREN) 1 % GEL SMARTSIG:Gram(s) Topical Twice Daily   dicyclomine (BENTYL) 10 MG capsule TAKE 1 CAPSULE (10 MG TOTAL) BY MOUTH 4 (FOUR) TIMES DAILY -  BEFORE MEALS AND AT BEDTIME.   famotidine (PEPCID) 40 MG tablet Take 1 tablet (40 mg total) by mouth 2 (two) times daily.   furosemide (LASIX) 20 MG tablet Take 1 tablet (20 mg total) by mouth daily.   hydrOXYzine (VISTARIL) 25 MG capsule Take 1 capsule (25 mg total) by mouth 3 (three) times daily.   levothyroxine (SYNTHROID) 175 MCG tablet Take 1 tablet (175 mcg total) by mouth daily.   metolazone (ZAROXOLYN) 5 MG tablet    metoprolol tartrate (LOPRESSOR) 25 MG tablet Take 1 tablet (25 mg total) by mouth 2 (two) times daily.   montelukast (SINGULAIR) 10 MG tablet Take 1 tablet (10 mg total) by mouth every evening.   morphine (MS CONTIN) 60 MG 12 hr tablet SMARTSIG:1 Tablet(s) By Mouth Every 12 Hours   Multiple Vitamin (MULTI-VITAMIN) tablet Take by mouth.   Oxycodone HCl 20 MG TABS    potassium chloride SA (KLOR-CON M) 20 MEQ tablet Take 1 tablet (20 mEq total) by mouth 2 (two) times daily with a meal.   pravastatin (PRAVACHOL) 80 MG tablet Take 0.5 tablets (40 mg total) by mouth 2 (two) times daily.   predniSONE (DELTASONE) 50 MG tablet TAKE 1 TABLET EVERY  DAY WITH BREAKFAST   Skin Protectants, Misc. (EUCERIN) cream Apply topically as needed for dry skin.   tiZANidine (ZANAFLEX) 4 MG tablet Take 1 tablet (4 mg total) by mouth 3 (three) times daily.   torsemide (DEMADEX) 20 MG tablet Take by mouth.   No facility-administered encounter medications on file as of 01/13/2022.     Recent Office Vitals: BP Readings from Last 3 Encounters:  07/14/21 120/80  03/10/21 130/80  02/04/21 118/70   Pulse Readings from Last 3 Encounters:  07/14/21 71  03/10/21 66  02/04/21 79    Wt Readings from Last 3 Encounters:  07/14/21 291 lb (132 kg)  03/10/21 (!) 301 lb (136.5 kg)  02/04/21 (!) 305 lb (138.3 kg)     Kidney Function Lab Results  Component Value Date/Time   CREATININE 0.82 07/14/2021 02:34 PM   CREATININE 0.94 03/10/2021 03:57 PM   GFRNONAA 85 12/04/2020 03:44 PM   GFRAA 98 12/04/2020 03:44 PM    BMP Latest Ref Rng & Units 07/14/2021 03/10/2021 01/28/2021  Glucose 65 - 99 mg/dL 86 80 82  BUN 8 - 27 mg/dL 20 16 24   Creatinine 0.76 - 1.27 mg/dL 0.82 0.94 1.01  BUN/Creat Ratio 10 - 24 24 17 24   Sodium 134 - 144 mmol/L 138 141 144  Potassium 3.5 - 5.2 mmol/L 4.2 4.6 4.2  Chloride  96 - 106 mmol/L 101 103 104  CO2 20 - 29 mmol/L 24 22 24   Calcium 8.6 - 10.2 mg/dL 9.2 9.5 9.2     Current antihypertensive regimen:  Metoprolol 25 mg  2 times daily Furosemide 20 mg daily  Patient verbally confirms he is taking the above medications as directed. Yes  How often are you checking your Blood Pressure? Infrequently   Current home BP readings: 120/70 range he stated. He stated he does not check on a regular basis but always reads good.   Wrist or arm cuff:Arm Caffeine intake:Coffee Salt intake:Limited  Any readings above 180/120? No  What recent interventions/DTPs have been made by any provider to improve Blood Pressure control since last CPP Visit: Pt stated he has a doctors appt today, other than that nothing has changed.   Any recent  hospitalizations or ED visits since last visit with CPP? No  What diet changes have been made to improve Blood Pressure Control?  Pt denies any diet changes   What exercise is being done to improve your Blood Pressure Control?  Pt does not exercise due to arthritis   Adherence Review: Is the patient currently on ACE/ARB medication? Yes Does the patient have >5 day gap between last estimated fill dates? CPP to review  Care Gaps: Last annual wellness visit?None noted   Star Rating Drugs:  Medication:  Last Fill: Day Supply None noted   Elray Mcgregor, [place] Pharmacist Assistant  [phone]

## 2022-01-13 NOTE — Progress Notes (Deleted)
Subjective:  Patient ID: Antonio Evans, male    DOB: Jun 01, 1951  Age: 71 y.o. MRN: [id number]  No chief complaint on file.   HPI Afib: Patient is taking eliquis 5 mg daily, aspirin 81 mg daily. Hypothyroidism: He is taking levothyroxine 175 mcg daily. Hyperlipidemia: Currently taking pravastatin 40 mg twice daily.  Current Outpatient Medications on File Prior to Visit  Medication Sig Dispense Refill   apixaban (ELIQUIS) 5 MG TABS tablet Take 5 mg by mouth 2 (two) times daily.     aspirin 81 MG EC tablet Take by mouth.     aspirin 81 MG EC tablet Take by mouth.     Cholecalciferol 125 MCG (5000 UT) capsule Take by mouth.     cyanocobalamin 1000 MCG tablet Take by mouth.     diclofenac Sodium (VOLTAREN) 1 % GEL SMARTSIG:Gram(s) Topical Twice Daily     dicyclomine (BENTYL) 10 MG capsule TAKE 1 CAPSULE (10 MG TOTAL) BY MOUTH 4 (FOUR) TIMES DAILY -  BEFORE MEALS AND AT BEDTIME. 270 capsule 2   famotidine (PEPCID) 40 MG tablet Take 1 tablet (40 mg total) by mouth 2 (two) times daily. 180 tablet 2   furosemide (LASIX) 20 MG tablet Take 1 tablet (20 mg total) by mouth daily. 90 tablet 2   hydrOXYzine (VISTARIL) 25 MG capsule Take 1 capsule (25 mg total) by mouth 3 (three) times daily. 90 capsule 0   levothyroxine (SYNTHROID) 175 MCG tablet Take 1 tablet (175 mcg total) by mouth daily. 90 tablet 2   metolazone (ZAROXOLYN) 5 MG tablet      metoprolol tartrate (LOPRESSOR) 25 MG tablet Take 1 tablet (25 mg total) by mouth 2 (two) times daily. 180 tablet 2   montelukast (SINGULAIR) 10 MG tablet Take 1 tablet (10 mg total) by mouth every evening. 90 tablet 2   morphine (MS CONTIN) 60 MG 12 hr tablet SMARTSIG:1 Tablet(s) By Mouth Every 12 Hours     Multiple Vitamin (MULTI-VITAMIN) tablet Take by mouth.     Oxycodone HCl 20 MG TABS      potassium chloride SA (KLOR-CON M) 20 MEQ tablet Take 1 tablet (20 mEq total) by mouth 2 (two) times daily with a meal. 180 tablet 2   pravastatin (PRAVACHOL) 80 MG  tablet Take 0.5 tablets (40 mg total) by mouth 2 (two) times daily. 90 tablet 2   predniSONE (DELTASONE) 50 MG tablet TAKE 1 TABLET EVERY DAY WITH BREAKFAST 5 tablet 3   Skin Protectants, Misc. (EUCERIN) cream Apply topically as needed for dry skin. 454 g 6   tiZANidine (ZANAFLEX) 4 MG tablet Take 1 tablet (4 mg total) by mouth 3 (three) times daily. 405 tablet 2   torsemide (DEMADEX) 20 MG tablet Take by mouth.     No current facility-administered medications on file prior to visit.   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    Mixed hyperlipidemia    Morbid obesity due to excess calories (HCC)    Scoliosis    Urge incontinence    Past Surgical History:  Procedure Laterality Date   GASTRIC BYPASS N/A    HEMORRHOID SURGERY     HERNIA REPAIR     RHINOPLASTY      Family History  Problem Relation Age of Onset   COPD Mother    Lung cancer Father    Diabetes Brother    Social History  Socioeconomic History   Marital status: Single    Spouse name: Not on file   Number of children: Not on file   Years of education: Not on file   Highest education level: Not on file  Occupational History   Not on file  Tobacco Use   Smoking status: Former    Types: Cigarettes    Quit date: 76    Years since quitting: 39.1   Smokeless tobacco: Never  Vaping Use   Vaping Use: Never used  Substance and Sexual Activity   Alcohol use: Not Currently   Drug use: Never   Sexual activity: Not on file  Other Topics Concern   Not on file  Social History Narrative   Not on file   Social Determinants of Health   Financial Resource Strain: High Risk   Difficulty of Paying Living Expenses: Hard  Food Insecurity: Not on file  Transportation Needs: Not on file  Physical Activity: Not on file  Stress: Not on file  Social Connections: Not on file    Review of Systems   Objective:  There were no vitals taken  for this visit.  BP/Weight 07/14/2021 03/23/9562 07/05/5642  Systolic BP 329 518 841  Diastolic BP 80 80 70  Wt. (Lbs) 291 301 305  BMI 40.59 41.98 42.54    Physical Exam  Diabetic Foot Exam - Simple   No data filed      Lab Results  Component Value Date   WBC 4.9 07/14/2021   HGB 11.6 (L) 07/14/2021   HCT 37.6 07/14/2021   PLT 196 07/14/2021   GLUCOSE 86 07/14/2021   CHOL 91 (L) 07/14/2021   TRIG 59 07/14/2021   HDL 39 (L) 07/14/2021   LDLCALC 38 07/14/2021   ALT 29 07/14/2021   AST 33 07/14/2021   NA 138 07/14/2021   K 4.2 07/14/2021   CL 101 07/14/2021   CREATININE 0.82 07/14/2021   BUN 20 07/14/2021   CO2 24 07/14/2021   TSH 4.500 07/14/2021   HGBA1C 5.6 12/04/2020      Assessment & Plan:   Problem List Items Addressed This Visit       Cardiovascular and Mediastinum   Persistent atrial fibrillation (HCC) - Primary     Endocrine   Hypothyroidism     Musculoskeletal and Integument   Scoliosis     Other   Urge incontinence   Mixed hyperlipidemia   Chronic pain  .  No orders of the defined types were placed in this encounter.   No orders of the defined types were placed in this encounter.    Follow-up: No follow-ups on file.  An After Visit Summary was printed and given to the patient.  [name], MD [facility] [phone]

## 2022-01-14 ENCOUNTER — Ambulatory Visit: Payer: Medicare HMO | Admitting: Legal Medicine

## 2022-01-14 DIAGNOSIS — I4819 Other persistent atrial fibrillation: Secondary | ICD-10-CM

## 2022-01-14 DIAGNOSIS — M41125 Adolescent idiopathic scoliosis, thoracolumbar region: Secondary | ICD-10-CM

## 2022-01-14 DIAGNOSIS — E039 Hypothyroidism, unspecified: Secondary | ICD-10-CM

## 2022-01-14 DIAGNOSIS — E782 Mixed hyperlipidemia: Secondary | ICD-10-CM

## 2022-01-14 DIAGNOSIS — N3941 Urge incontinence: Secondary | ICD-10-CM

## 2022-01-14 DIAGNOSIS — G894 Chronic pain syndrome: Secondary | ICD-10-CM

## 2022-01-15 ENCOUNTER — Ambulatory Visit: Payer: Medicare HMO | Admitting: Legal Medicine

## 2022-01-20 ENCOUNTER — Encounter: Payer: Self-pay | Admitting: Legal Medicine

## 2022-01-20 ENCOUNTER — Ambulatory Visit: Payer: Medicare HMO | Admitting: Legal Medicine

## 2022-01-21 ENCOUNTER — Ambulatory Visit: Payer: Medicare HMO | Admitting: Legal Medicine

## 2022-01-21 ENCOUNTER — Encounter: Payer: Self-pay | Admitting: Legal Medicine

## 2022-01-21 ENCOUNTER — Ambulatory Visit (INDEPENDENT_AMBULATORY_CARE_PROVIDER_SITE_OTHER): Payer: Medicare HMO | Admitting: Legal Medicine

## 2022-01-21 ENCOUNTER — Other Ambulatory Visit: Payer: Self-pay

## 2022-01-21 VITALS — BP 120/84 | HR 84 | Temp 98.5°F | Resp 15 | Ht 71.0 in | Wt 287.0 lb

## 2022-01-21 DIAGNOSIS — E039 Hypothyroidism, unspecified: Secondary | ICD-10-CM

## 2022-01-21 DIAGNOSIS — I4819 Other persistent atrial fibrillation: Secondary | ICD-10-CM

## 2022-01-21 DIAGNOSIS — Z23 Encounter for immunization: Secondary | ICD-10-CM | POA: Diagnosis not present

## 2022-01-21 DIAGNOSIS — E782 Mixed hyperlipidemia: Secondary | ICD-10-CM | POA: Diagnosis not present

## 2022-01-21 DIAGNOSIS — G894 Chronic pain syndrome: Secondary | ICD-10-CM

## 2022-01-21 DIAGNOSIS — Z79891 Long term (current) use of opiate analgesic: Secondary | ICD-10-CM

## 2022-01-21 DIAGNOSIS — G6289 Other specified polyneuropathies: Secondary | ICD-10-CM

## 2022-01-21 DIAGNOSIS — D6869 Other thrombophilia: Secondary | ICD-10-CM

## 2022-01-21 DIAGNOSIS — M5134 Other intervertebral disc degeneration, thoracic region: Secondary | ICD-10-CM

## 2022-01-21 DIAGNOSIS — N400 Enlarged prostate without lower urinary tract symptoms: Secondary | ICD-10-CM

## 2022-01-21 MED ORDER — AMITRIPTYLINE HCL 10 MG PO TABS: 10.0000 mg | ORAL_TABLET | Freq: Every day | ORAL | 2 refills | Status: DC

## 2022-01-21 NOTE — Progress Notes (Signed)
Subjective:  Patient ID: Antonio Evans, male    DOB: 04-28-1951  Age: 71 y.o. MRN: [id number]  Chief Complaint  Patient presents with   Hypothyroidism   Hyperlipidemia   Atrial Fibrillation    HPI Hyperlipidemia: He takes pravastatin 80 mg daily. Patient presents with hyperlipidemia.  Compliance with treatment has been good; patient takes medicines as directed, maintains low cholesterol diet, follows up as directed, and maintains exercise regimen.  Patient is using pravastatin without problems.   Hypothyroidism: Currently levothyroxine 175 mcg daily. He is doing well  Chronic Pain syndrome: Oxycodone 20 mg and morphine 60 mg every 12 hours.  GERD: Patient is taking famotidine 40 mg bid.  Afib: Metoprolol 25 mg bid, eliquis 5 mg bid. Patient has a diagnosis of chronic atrial fibrillation.   Patient is on eliquis and has controlled ventricular response.  Patient is CV stable.  Current Outpatient Medications on File Prior to Visit  Medication Sig Dispense Refill   apixaban (ELIQUIS) 5 MG TABS tablet Take 5 mg by mouth 2 (two) times daily.     aspirin 81 MG EC tablet Take by mouth.     Cholecalciferol 125 MCG (5000 UT) capsule Take by mouth.     cyanocobalamin 1000 MCG tablet Take by mouth.     diclofenac Sodium (VOLTAREN) 1 % GEL SMARTSIG:Gram(s) Topical Twice Daily     dicyclomine (BENTYL) 10 MG capsule TAKE 1 CAPSULE (10 MG TOTAL) BY MOUTH 4 (FOUR) TIMES DAILY -  BEFORE MEALS AND AT BEDTIME. 270 capsule 2   famotidine (PEPCID) 40 MG tablet Take 1 tablet (40 mg total) by mouth 2 (two) times daily. 180 tablet 2   furosemide (LASIX) 20 MG tablet Take 1 tablet (20 mg total) by mouth daily. 90 tablet 2   hydrOXYzine (VISTARIL) 25 MG capsule Take 1 capsule (25 mg total) by mouth 3 (three) times daily. 90 capsule 0   levothyroxine (SYNTHROID) 175 MCG tablet Take 1 tablet (175 mcg total) by mouth daily. 90 tablet 2   metoprolol tartrate (LOPRESSOR) 25 MG tablet Take 1 tablet (25 mg total)  by mouth 2 (two) times daily. 180 tablet 2   montelukast (SINGULAIR) 10 MG tablet Take 1 tablet (10 mg total) by mouth every evening. 90 tablet 2   morphine (MS CONTIN) 60 MG 12 hr tablet SMARTSIG:1 Tablet(s) By Mouth Every 12 Hours     Multiple Vitamin (MULTI-VITAMIN) tablet Take by mouth.     naloxone (NARCAN) nasal spray 4 mg/0.1 mL SMARTSIG:Both Nares     Oxycodone HCl 20 MG TABS      potassium chloride SA (KLOR-CON M) 20 MEQ tablet Take 1 tablet (20 mEq total) by mouth 2 (two) times daily with a meal. 180 tablet 2   pravastatin (PRAVACHOL) 80 MG tablet Take 0.5 tablets (40 mg total) by mouth 2 (two) times daily. 90 tablet 2   Skin Protectants, Misc. (EUCERIN) cream Apply topically as needed for dry skin. 454 g 6   tiZANidine (ZANAFLEX) 4 MG tablet Take 1 tablet (4 mg total) by mouth 3 (three) times daily. 405 tablet 2   metolazone (ZAROXOLYN) 5 MG tablet      No current facility-administered medications on file prior to visit.   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    Mixed hyperlipidemia    Morbid obesity due to excess calories (HCC)    Scoliosis    Urge  incontinence    Past Surgical History:  Procedure Laterality Date   GASTRIC BYPASS N/A    HEMORRHOID SURGERY     HERNIA REPAIR     RHINOPLASTY      Family History  Problem Relation Age of Onset   COPD Mother    Lung cancer Father    Diabetes Brother    Social History   Socioeconomic History   Marital status: Single    Spouse name: Not on file   Number of children: Not on file   Years of education: Not on file   Highest education level: Not on file  Occupational History   Not on file  Tobacco Use   Smoking status: Former    Types: Cigarettes    Quit date: 33    Years since quitting: 39.1   Smokeless tobacco: Never  Vaping Use   Vaping Use: Never used  Substance and Sexual Activity   Alcohol use: Not Currently   Drug use:  Never   Sexual activity: Not on file  Other Topics Concern   Not on file  Social History Narrative   Not on file   Social Determinants of Health   Financial Resource Strain: High Risk   Difficulty of Paying Living Expenses: Hard  Food Insecurity: Not on file  Transportation Needs: Not on file  Physical Activity: Not on file  Stress: Not on file  Social Connections: Not on file    Review of Systems  Constitutional:  Negative for chills, fatigue, fever and unexpected weight change.  HENT:  Negative for congestion, ear pain, sinus pain and sore throat.   Eyes:  Negative for visual disturbance.  Respiratory:  Positive for shortness of breath.   Cardiovascular:  Negative for chest pain and palpitations.  Gastrointestinal:  Negative for abdominal pain, blood in stool, constipation, diarrhea, nausea and vomiting.  Endocrine: Negative for polydipsia.  Genitourinary:  Negative for dysuria.  Musculoskeletal:  Positive for arthralgias, back pain, myalgias and neck pain.  Skin:  Negative for rash.  Neurological:  Negative for headaches.  Psychiatric/Behavioral: Negative.      Objective:  BP 120/84    Pulse 84    Temp 98.5 F (36.9 C)    Resp 15    Ht 5\' 11"  (1.803 m)    Wt 287 lb (130.2 kg)    SpO2 98%    BMI 40.03 kg/m   BP/Weight 01/21/2022 07/14/2021 7/82/4235  Systolic BP 361 443 154  Diastolic BP 84 80 80  Wt. (Lbs) 287 291 301  BMI 40.03 40.59 41.98    Physical Exam Vitals reviewed.  Constitutional:      General: He is not in acute distress.    Appearance: Normal appearance. He is obese.  HENT:     Right Ear: Tympanic membrane normal.     Left Ear: Tympanic membrane normal.     Nose: Nose normal.     Mouth/Throat:     Mouth: Mucous membranes are moist.     Pharynx: Oropharynx is clear.  Eyes:     Extraocular Movements: Extraocular movements intact.     Conjunctiva/sclera: Conjunctivae normal.     Pupils: Pupils are equal, round, and reactive to light.   Cardiovascular:     Rate and Rhythm: Normal rate. Rhythm irregular.     Pulses: Normal pulses.     Heart sounds: Normal heart sounds. No murmur heard.   No gallop.  Pulmonary:     Effort: Pulmonary effort is normal. No respiratory distress.  Breath sounds: Normal breath sounds. No wheezing.  Abdominal:     General: Abdomen is flat. Bowel sounds are normal. There is no distension.     Tenderness: There is no abdominal tenderness.  Musculoskeletal:        General: Normal range of motion.     Cervical back: Normal range of motion and neck supple.     Right lower leg: No edema.     Left lower leg: No edema.  Skin:    General: Skin is warm.     Capillary Refill: Capillary refill takes less than 2 seconds.  Neurological:     Mental Status: He is alert. Mental status is at baseline.     Motor: Weakness (le) present.     Gait: Gait abnormal.     Comments: Walks with cane  Psychiatric:        Mood and Affect: Mood normal.        Behavior: Behavior normal.        Thought Content: Thought content normal.        Judgment: Judgment normal.        Lab Results  Component Value Date   WBC 4.9 07/14/2021   HGB 11.6 (L) 07/14/2021   HCT 37.6 07/14/2021   PLT 196 07/14/2021   GLUCOSE 86 07/14/2021   CHOL 91 (L) 07/14/2021   TRIG 59 07/14/2021   HDL 39 (L) 07/14/2021   LDLCALC 38 07/14/2021   ALT 29 07/14/2021   AST 33 07/14/2021   NA 138 07/14/2021   K 4.2 07/14/2021   CL 101 07/14/2021   CREATININE 0.82 07/14/2021   BUN 20 07/14/2021   CO2 24 07/14/2021   TSH 4.500 07/14/2021   HGBA1C 5.6 12/04/2020      Assessment & Plan:   Problem List Items Addressed This Visit       Cardiovascular and Mediastinum   Persistent atrial fibrillation ([place])   Relevant Orders   Comprehensive metabolic panel   CBC with Differential/Platelet Patient has a diagnosis of Chronic atrial fibrillation.   Patient is on eliquis and has controlled ventricular response.  Patient is CV stable.       Endocrine   Hypothyroidism   Relevant Orders   TSH Patient is known to have Hypothyroidism and is n treatment with levothyroxine 120mcg.  Patient was diagnosed 10 years ago.  Other treatment includes none.  Patient is compliant with medicines and last TSH 6 months ago.  Last TSH was normal.      Hematopoietic and Hemostatic   Other thrombophilia ([place]) Patient is on eliquis     Other   Mixed hyperlipidemia - Primary   Relevant Orders   Lipid panel AN INDIVIDUAL CARE PLAN for hyperlipidemia/ cholesterol was established and reinforced today.  The patient's status was assessed using clinical findings on exam, lab and other diagnostic tests. The patient's disease status was assessed based on evidence-based guidelines and found to be well controlled. MEDICATIONS were reviewed. SELF MANAGEMENT GOALS have been discussed and patient's success at attaining the goal of low cholesterol was assessed. RECOMMENDATION given include regular exercise 3 days a week and low cholesterol/low fat diet. CLINICAL SUMMARY including written plan to identify barriers unique to the patient due to social or economic  reasons was discussed.    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  The patient's status was assessed using clinical findings on exam, labs, and other diagnostic testing.  Patient's success at meeting treatment goals based on disease specific evidence-bassed guidelines and found to be in good control. RECOMMENDATIONS include maintaining present medicines and treatment.     Other polyneuropathy       Relevant Medications   amitriptyline (ELAVIL) 10 MG tablet   Other Relevant Orders   DME Wheelchair manual Patient has chronic polyneuropathy    Need for immunization against influenza       Relevant Orders   Flu Vaccine QUAD High Dose(Fluad) (Completed)     .  Meds ordered this encounter  Medications   amitriptyline (ELAVIL) 10 MG tablet    Sig: Take 1 tablet (10 mg total) by mouth at bedtime.    Dispense:  90 tablet    Refill:  2    Orders Placed This Encounter  Procedures   DME Wheelchair manual   Flu Vaccine QUAD High Dose(Fluad)   Comprehensive metabolic panel   Lipid panel   CBC with Differential/Platelet   TSH   PSA   30 minute visit with review of records  Follow-up: Return in about 6 months (around 07/21/2022) for fasting.  An After Visit Summary was printed and given to the patient.  [name], MD [facility] [phone]

## 2022-01-22 LAB — COMPREHENSIVE METABOLIC PANEL
ALT: 24 IU/L (ref 0–44)
AST: 31 IU/L (ref 0–40)
Albumin/Globulin Ratio: 1.8 (ref 1.2–2.2)
Albumin: 4.4 g/dL (ref 3.8–4.8)
Alkaline Phosphatase: 102 IU/L (ref 44–121)
BUN/Creatinine Ratio: 18 (ref 10–24)
BUN: 14 mg/dL (ref 8–27)
Bilirubin Total: 0.5 mg/dL (ref 0.0–1.2)
CO2: 22 mmol/L (ref 20–29)
Calcium: 9.5 mg/dL (ref 8.6–10.2)
Chloride: 104 mmol/L (ref 96–106)
Creatinine, Ser: 0.8 mg/dL (ref 0.76–1.27)
Globulin, Total: 2.4 g/dL (ref 1.5–4.5)
Glucose: 85 mg/dL (ref 70–99)
Potassium: 4.3 mmol/L (ref 3.5–5.2)
Sodium: 139 mmol/L (ref 134–144)
Total Protein: 6.8 g/dL (ref 6.0–8.5)
eGFR: 96 mL/min/{1.73_m2} (ref >59)

## 2022-01-22 LAB — LIPID PANEL
Chol/HDL Ratio: 2.2 ratio (ref 0.0–5.0)
Cholesterol, Total: 92 mg/dL — ABNORMAL LOW (ref 100–199)
HDL: 41 mg/dL (ref >39)
LDL Chol Calc (NIH): 38 mg/dL (ref 0–99)
Triglycerides: 53 mg/dL (ref 0–149)
VLDL Cholesterol Cal: 13 mg/dL (ref 5–40)

## 2022-01-22 LAB — CARDIOVASCULAR RISK ASSESSMENT

## 2022-01-22 LAB — CBC WITH DIFFERENTIAL/PLATELET
Basophils Absolute: 0 10*3/uL (ref 0.0–0.2)
Basos: 1 %
EOS (ABSOLUTE): 0.2 10*3/uL (ref 0.0–0.4)
Eos: 6 %
Hematocrit: 37.3 % — ABNORMAL LOW (ref 37.5–51.0)
Hemoglobin: 12.1 g/dL — ABNORMAL LOW (ref 13.0–17.7)
Immature Grans (Abs): 0 10*3/uL (ref 0.0–0.1)
Immature Granulocytes: 0 %
Lymphocytes Absolute: 0.9 10*3/uL (ref 0.7–3.1)
Lymphs: 23 %
MCH: 27.4 pg (ref 26.6–33.0)
MCHC: 32.4 g/dL (ref 31.5–35.7)
MCV: 84 fL (ref 79–97)
Monocytes Absolute: 0.4 10*3/uL (ref 0.1–0.9)
Monocytes: 9 %
Neutrophils Absolute: 2.5 10*3/uL (ref 1.4–7.0)
Neutrophils: 61 %
Platelets: 181 10*3/uL (ref 150–450)
RBC: 4.42 x10E6/uL (ref 4.14–5.80)
RDW: 14.4 % (ref 11.6–15.4)
WBC: 4.1 10*3/uL (ref 3.4–10.8)

## 2022-01-22 LAB — PSA: Prostate Specific Ag, Serum: 0.7 ng/mL (ref 0.0–4.0)

## 2022-01-22 LAB — TSH: TSH: 2.76 u[IU]/mL (ref 0.450–4.500)

## 2022-01-22 NOTE — Progress Notes (Signed)
Cholesterol OK, CBC improvement on anemia, kidney and liver tests normal, TSH 2.76 normal, PSA 0.7 normal lp

## 2022-02-02 ENCOUNTER — Telehealth: Payer: Medicare HMO

## 2022-03-03 IMAGING — MR MR THORACIC SPINE W/O CM
4 of 5 series · 15 of 48 positions shown · non-contrast
Comparison: MRI of the cervical spine October 21, 2013.

MRI of the thoracic spine December 26, 2013.

CLINICAL DATA: Other intervertebral disc degeneration, thoracic
region 9RX.YL; Other urinary incontinence ID7.M7W; Other chronic
pain FH4.84; Pain in joint involving pelvic region and thigh,
unspecified laterality N4L.LLV; Cervical spinal stenosis NT8.W5;
Other chronic pain G89.29.

EXAM:
MRI CERVICAL AND THORACIC SPINE WITHOUT CONTRAST
TECHNIQUE: Multiplanar and multiecho pulse sequences of the cervical spine, to
include the craniocervical junction and cervicothoracic junction,
and the thoracic spine, were obtained without intravenous contrast.

[Series 4: STIR · sagittal · 3.0mm · 1.09mm/px · 3 of 19 slices shown]
[im 3/19]
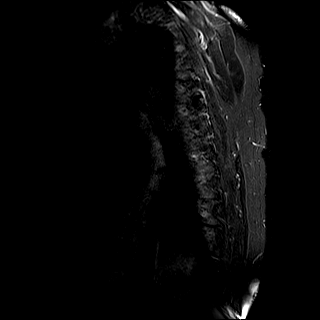
[im 11/19]
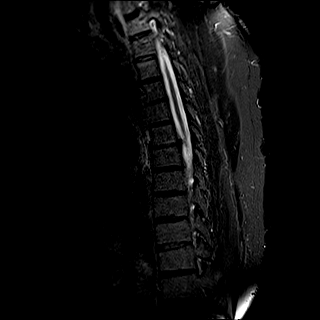
[im 16/19]
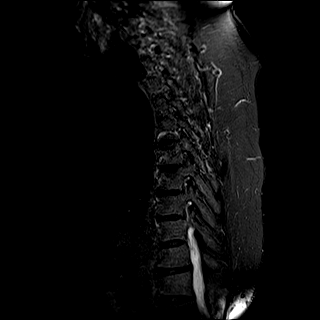

[Series 7: T1 · sagittal · 3.0mm · 0.55mm/px · 3 of 19 slices shown]
[im 4/19]
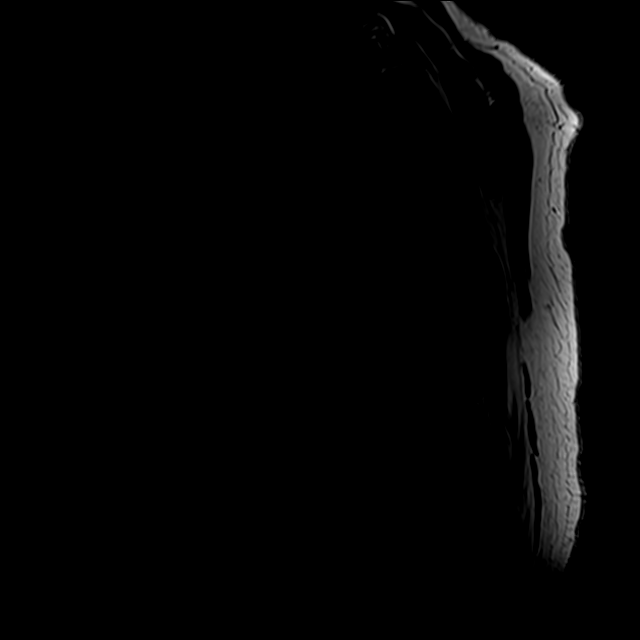
[im 10/19]
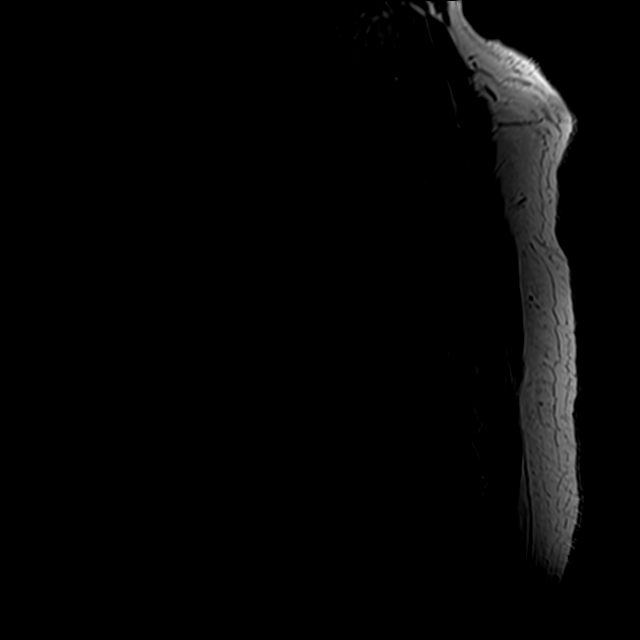
[im 16/19]
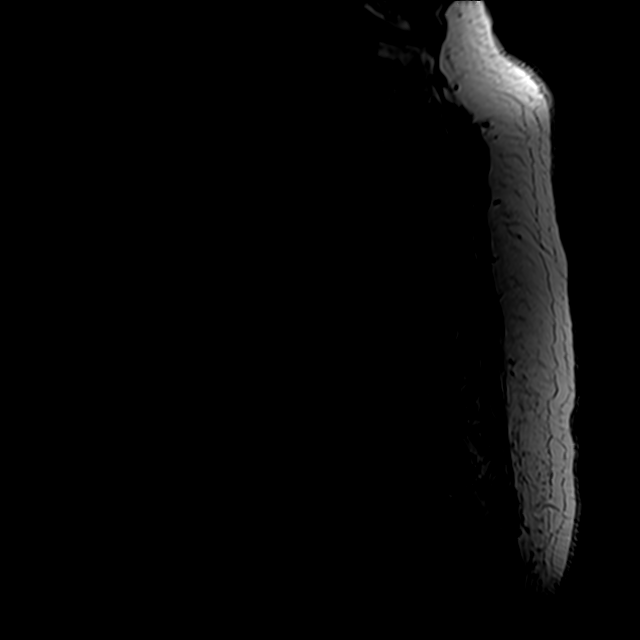

[Series 8: T2 post-contrast · sagittal · 3.0mm · 0.55mm/px · 6 of 19 slices shown]
[im 1/19]
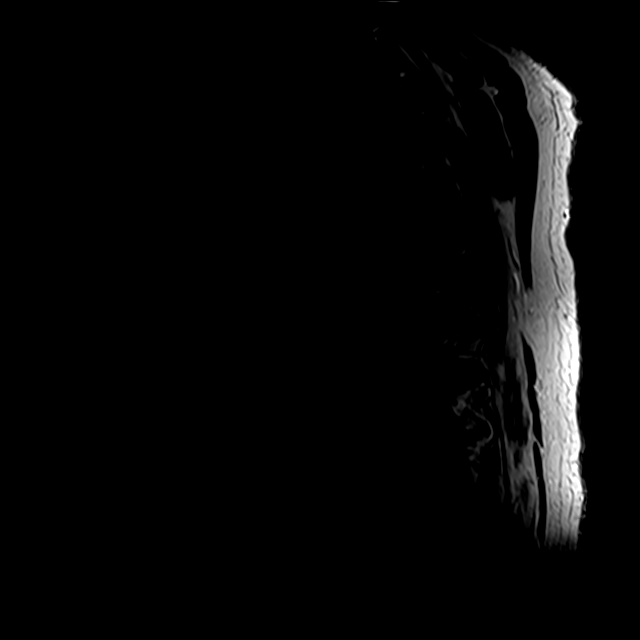
[im 4/19]
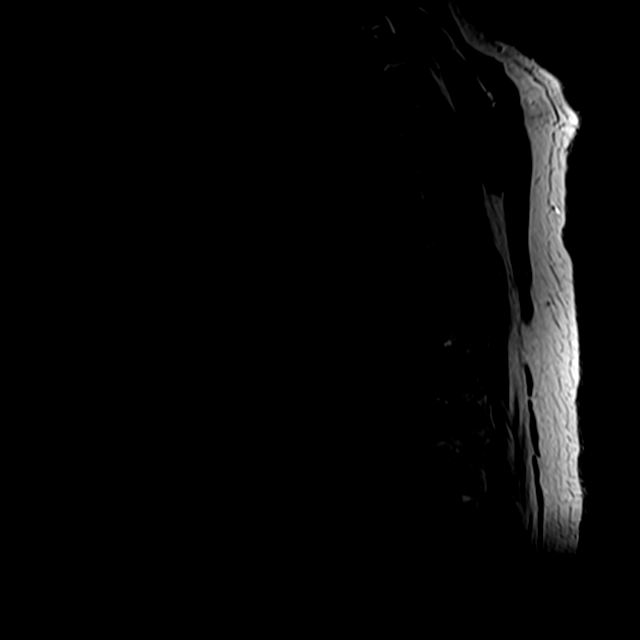
[im 7/19]
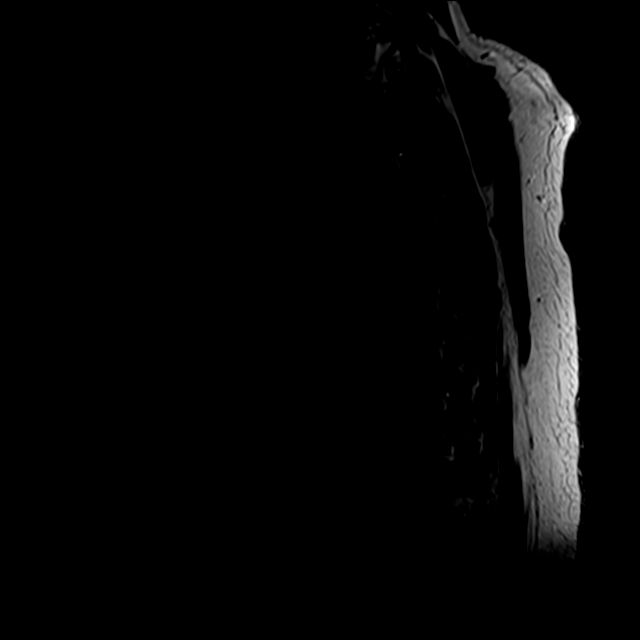
[im 10/19]
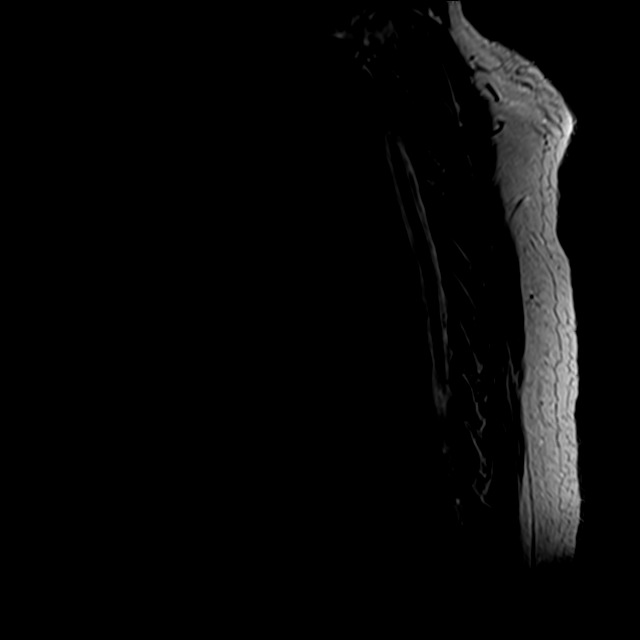
[im 13/19]
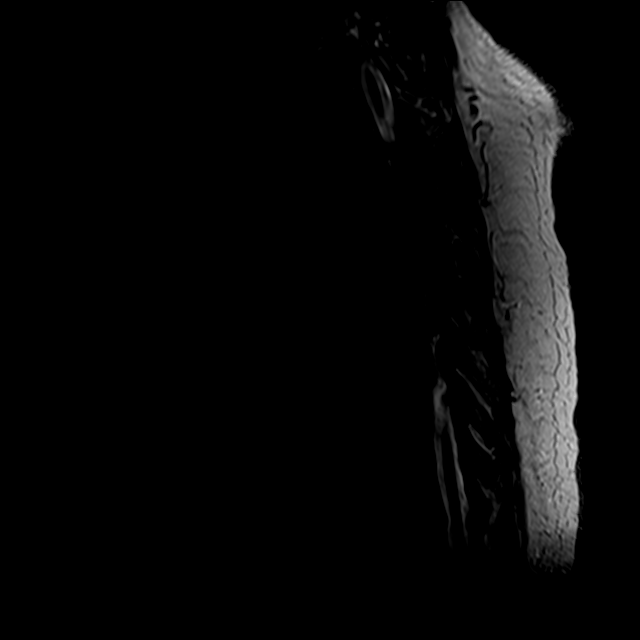
[im 16/19]
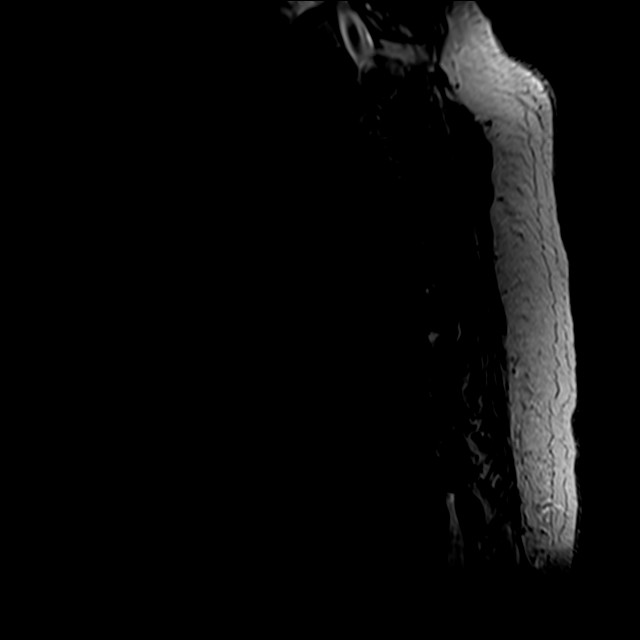

[Series 9: T2 · axial · 4.0mm · 0.39mm/px · z∈[-369,-180]mm · 3 of 36 slices shown]
[im 6/36]
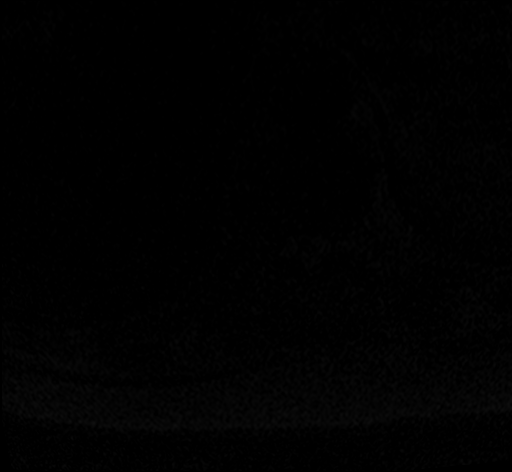
[im 18/36]
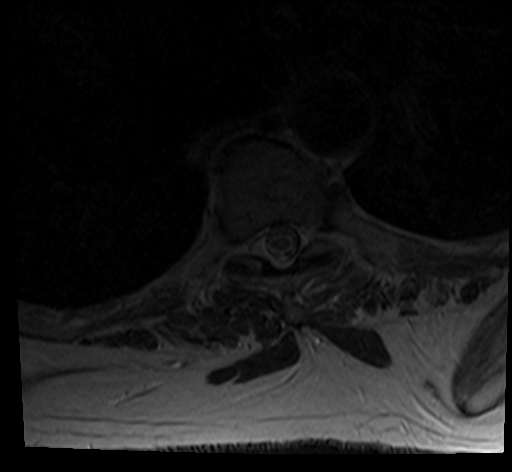
[im 30/36]
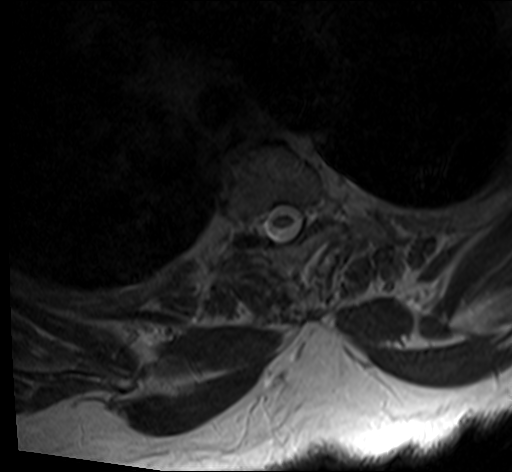

[15 of 48 positions shown; findings below may reference images not displayed]

FINDINGS: The study is partially degraded by motion.

MRI CERVICAL SPINE FINDINGS

Alignment: Small anterolisthesis of C2 over C3 and retrolisthesis of
C3 over C4.

Vertebrae: No fracture, evidence of discitis, or bone lesion.

Cord: Mild mass effect on the cord at C3-4 without cord signal
abnormality.

Posterior Fossa, vertebral arteries, paraspinal tissues: Negative.

Disc levels:

C2-3: No spinal canal or neural foraminal stenosis.

C3-4: Posterior disc osteophyte complex resulting in moderate spinal
canal stenosis. Uncovertebral and facet degenerative changes
resulting in mild right and severe left neural foraminal narrowing.
Findings are progressed since prior MRI.

C4-5: Posterior disc protrusion resulting in mild spinal canal
stenosis. Facet degenerative changes without significant neural
foraminal narrowing.

C5-6: Posterior disc protrusion without significant spinal canal
stenosis. Facet degenerative without significant neural foraminal
narrowing.

C6-7: Posterior disc protrusion without significant spinal canal
stenosis. Facet degenerative changes resulting in mild left neural
foraminal narrowing.

C7-T1: No spinal canal or neural foraminal stenosis.

MRI THORACIC SPINE FINDINGS

Alignment:  Dextroconvex scoliosis.

Vertebrae: No fracture, evidence of discitis, or bone lesion.

Cord:  Normal signal and morphology.

Paraspinal and other soft tissues: Negative.

Disc levels:

T4-5: Small posterior disc protrusion. No significant spinal canal
or neural foraminal stenosis.

T6-7: Right posterolateral disc protrusion causing indentation of
the thecal sac without significant spinal canal or neural foraminal
stenosis.

T7-8: Right posterolateral disc protrusion causing indentation of
the thecal sac without significant spinal canal or neural foraminal
stenosis.

T8-9: Right posterolateral disc protrusion causing small indentation
of the thecal sac without significant spinal canal or neural
foraminal stenosis.

T11-12: Small posterior disc protrusion causing minimal indentation
of the thecal sac without significant spinal canal or neural
foraminal stenosis.

No significant disc herniation, spinal canal or neural foraminal
stenosis in the other levels.

No significant change from prior MRI.
IMPRESSION: 1. Degenerative changes of the cervical spine, progressed from prior
MRI. Findings are more pronounced at C3-4 where there is moderate
spinal canal stenosis, mild right and severe left neural foraminal
narrowing.
2. Mild degenerative changes of the thoracic spine with small
posterior disc protrusions at T4-5, T6-7, T7-8, T8-9 and T11-12
without significant spinal canal or neural foraminal stenosis at any
level.

## 2022-03-03 IMAGING — MR MR CERVICAL SPINE W/O CM
4 of 5 series · 28 of 48 positions shown · non-contrast
Comparison: MRI of the cervical spine October 21, 2013.

MRI of the thoracic spine December 26, 2013.

CLINICAL DATA: Other intervertebral disc degeneration, thoracic
region 9RX.YL; Other urinary incontinence ID7.M7W; Other chronic
pain FH4.84; Pain in joint involving pelvic region and thigh,
unspecified laterality N4L.LLV; Cervical spinal stenosis NT8.W5;
Other chronic pain G89.29.

EXAM:
MRI CERVICAL AND THORACIC SPINE WITHOUT CONTRAST
TECHNIQUE: Multiplanar and multiecho pulse sequences of the cervical spine, to
include the craniocervical junction and cervicothoracic junction,
and the thoracic spine, were obtained without intravenous contrast.

[Series 3: T2 · sagittal · 3.0mm · 0.66mm/px · 8 of 18 slices shown (1 of 2)]
[im 1/18]
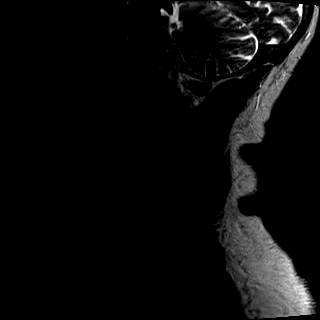
[im 3/18]
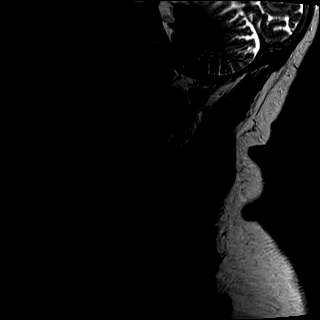
[im 5/18]
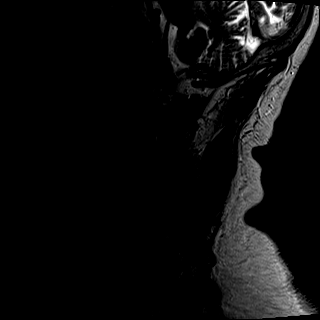
[im 8/18]
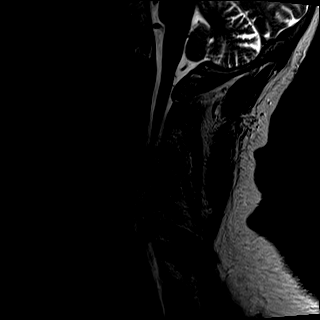
[im 10/18]
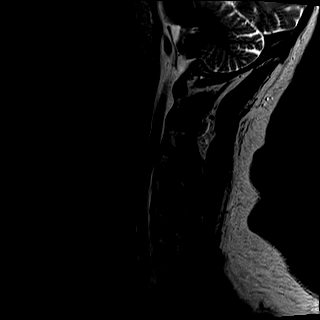
[im 13/18]
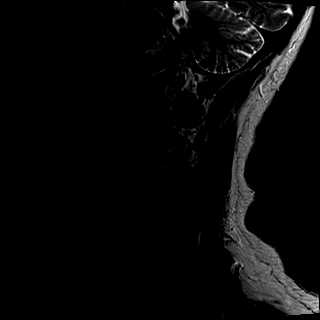
[im 15/18]
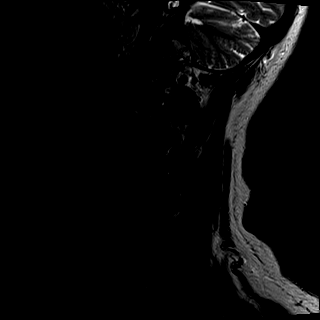
[im 18/18]
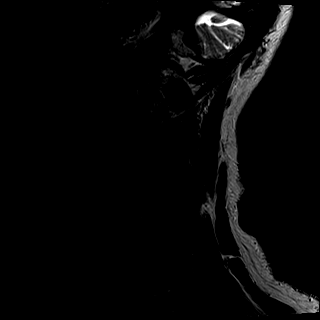

[Series 4: T1 · sagittal · 3.0mm · 0.41mm/px · 7 of 18 slices shown]
[im 1/18]
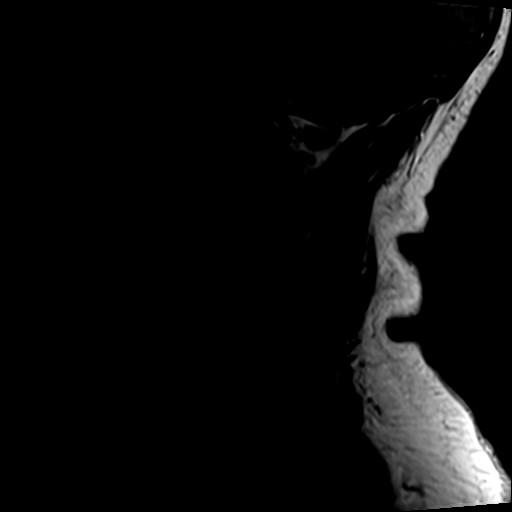
[im 3/18]
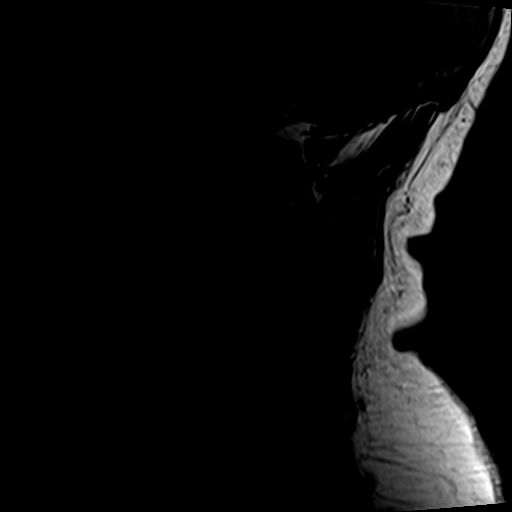
[im 6/18]
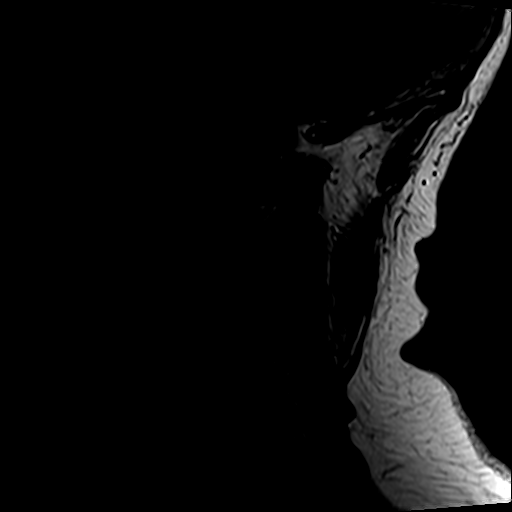
[im 9/18]
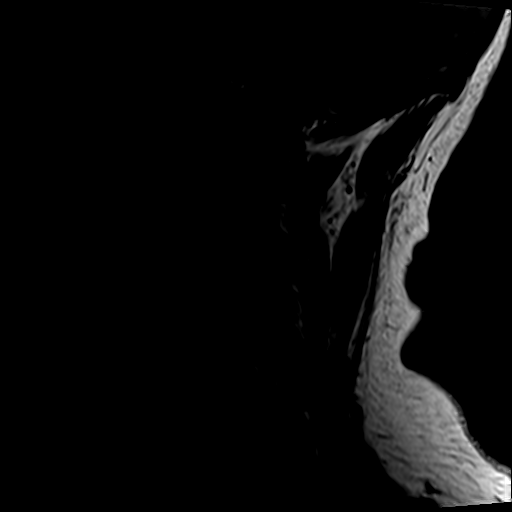
[im 12/18]
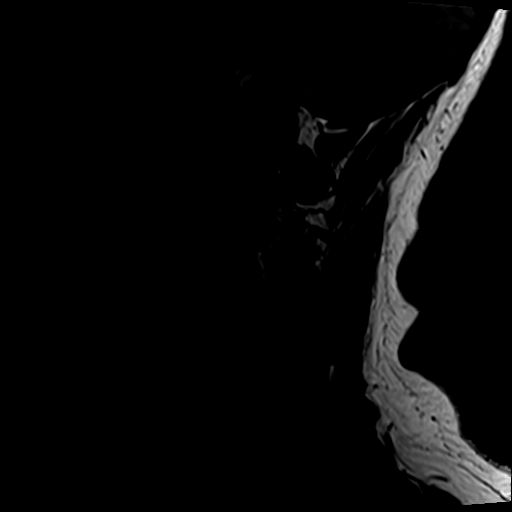
[im 15/18]
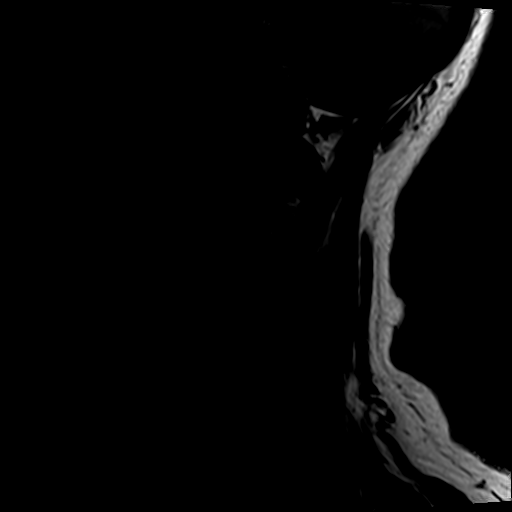
[im 18/18]
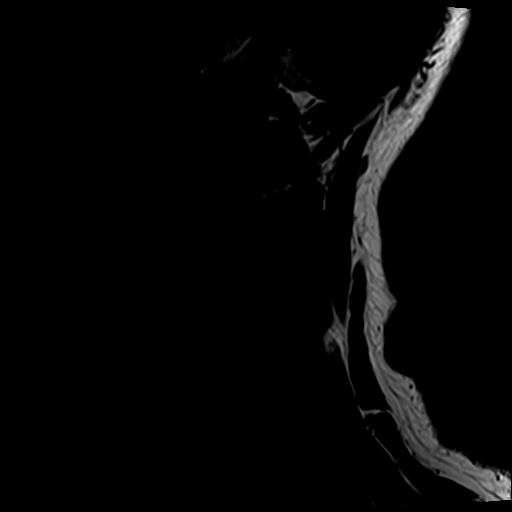

[Series 5: tir sag · sagittal · 3.0mm · 0.41mm/px · 4 of 18 slices shown]
[im 1/18]
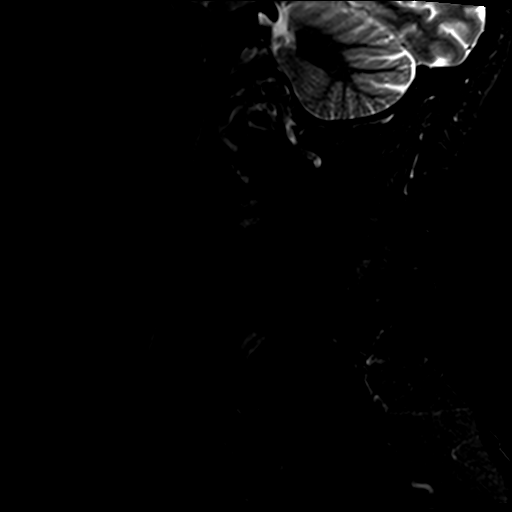
[im 3/18]
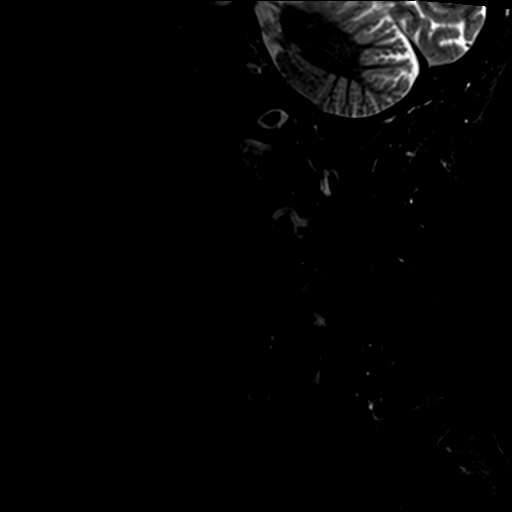
[im 9/18]
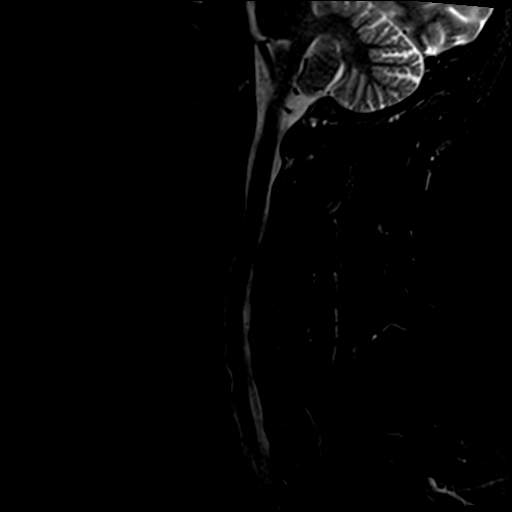
[im 15/18]
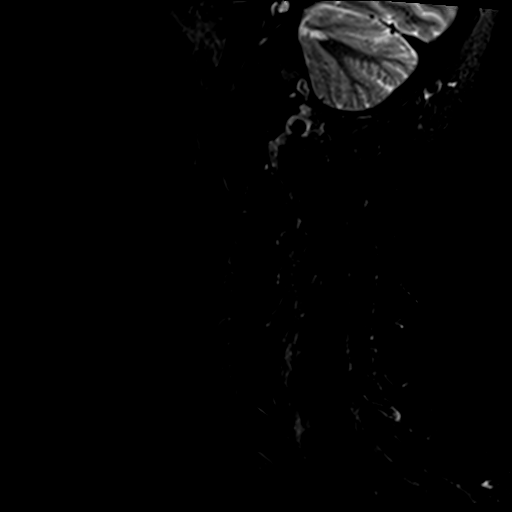

[Series 7: T2 · axial · 3.0mm · 0.70mm/px · z∈[-79,+40]mm · 9 of 32 slices shown (2 of 2)]
[im 1/32]
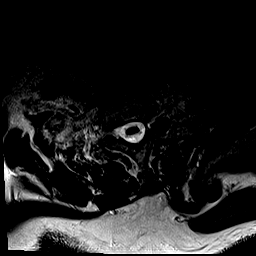
[im 6/32]
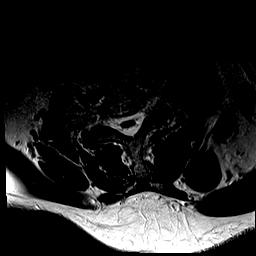
[im 11/32]
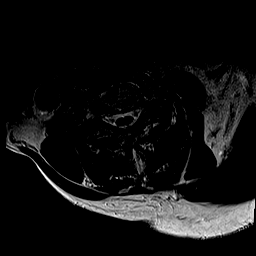
[im 13/32]
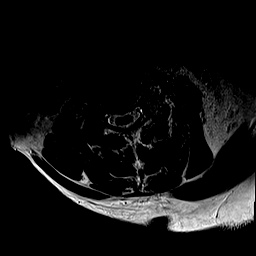
[im 16/32]
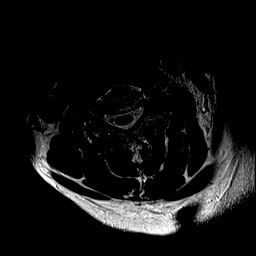
[im 19/32]
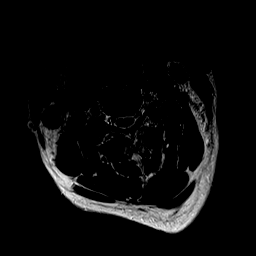
[im 21/32]
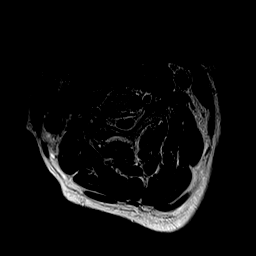
[im 26/32]
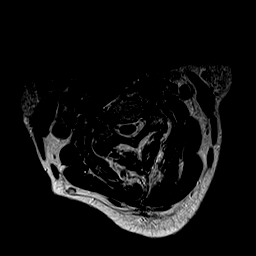
[im 32/32]
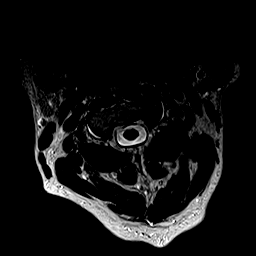

[28 of 48 positions shown; findings below may reference images not displayed]

FINDINGS: The study is partially degraded by motion.

MRI CERVICAL SPINE FINDINGS

Alignment: Small anterolisthesis of C2 over C3 and retrolisthesis of
C3 over C4.

Vertebrae: No fracture, evidence of discitis, or bone lesion.

Cord: Mild mass effect on the cord at C3-4 without cord signal
abnormality.

Posterior Fossa, vertebral arteries, paraspinal tissues: Negative.

Disc levels:

C2-3: No spinal canal or neural foraminal stenosis.

C3-4: Posterior disc osteophyte complex resulting in moderate spinal
canal stenosis. Uncovertebral and facet degenerative changes
resulting in mild right and severe left neural foraminal narrowing.
Findings are progressed since prior MRI.

C4-5: Posterior disc protrusion resulting in mild spinal canal
stenosis. Facet degenerative changes without significant neural
foraminal narrowing.

C5-6: Posterior disc protrusion without significant spinal canal
stenosis. Facet degenerative without significant neural foraminal
narrowing.

C6-7: Posterior disc protrusion without significant spinal canal
stenosis. Facet degenerative changes resulting in mild left neural
foraminal narrowing.

C7-T1: No spinal canal or neural foraminal stenosis.

MRI THORACIC SPINE FINDINGS

Alignment:  Dextroconvex scoliosis.

Vertebrae: No fracture, evidence of discitis, or bone lesion.

Cord:  Normal signal and morphology.

Paraspinal and other soft tissues: Negative.

Disc levels:

T4-5: Small posterior disc protrusion. No significant spinal canal
or neural foraminal stenosis.

T6-7: Right posterolateral disc protrusion causing indentation of
the thecal sac without significant spinal canal or neural foraminal
stenosis.

T7-8: Right posterolateral disc protrusion causing indentation of
the thecal sac without significant spinal canal or neural foraminal
stenosis.

T8-9: Right posterolateral disc protrusion causing small indentation
of the thecal sac without significant spinal canal or neural
foraminal stenosis.

T11-12: Small posterior disc protrusion causing minimal indentation
of the thecal sac without significant spinal canal or neural
foraminal stenosis.

No significant disc herniation, spinal canal or neural foraminal
stenosis in the other levels.

No significant change from prior MRI.
IMPRESSION: 1. Degenerative changes of the cervical spine, progressed from prior
MRI. Findings are more pronounced at C3-4 where there is moderate
spinal canal stenosis, mild right and severe left neural foraminal
narrowing.
2. Mild degenerative changes of the thoracic spine with small
posterior disc protrusions at T4-5, T6-7, T7-8, T8-9 and T11-12
without significant spinal canal or neural foraminal stenosis at any
level.

## 2022-03-30 ENCOUNTER — Telehealth: Payer: Self-pay

## 2022-03-30 ENCOUNTER — Ambulatory Visit: Payer: Medicare HMO | Admitting: Legal Medicine

## 2022-03-30 NOTE — Telephone Encounter (Signed)
Patient left a voicemail on check-in. Stated he missed his appt today due falling last night. Called patient to triage. States he was in the restroom around 5 a.m. passed out and woke up around 8 a.m. states he believes he had LOC and woke up face down on a pile of trash. Denies any visible wounds/bruises, no bleeding, headache, nausea, dizziness or vision changes. Advised he go be evaluated at the ED but he declined to go. Did express he should be evaluated at the ED since he hit his head but again he declined. ?

## 2022-03-31 ENCOUNTER — Telehealth: Payer: Self-pay

## 2022-03-31 ENCOUNTER — Ambulatory Visit: Payer: Medicare HMO | Admitting: Legal Medicine

## 2022-03-31 NOTE — Telephone Encounter (Signed)
Mr. Pacella came in the office today to be checked in for his acute appointment for Fall and urine problems. As I was checking in the patient I verified everything under patient registation with him and I noticed that he has a balance of $425.00 from 12/2021. After further review of the balance, the EOB stated that we are out of network. The patient has [employer]. Patient was notified that this is an insurance we do not take. I told the patient that I can see how this was easily missed at his last appointment. Had we of noticed the card said [facility], we would have told the patient before seeing him that day. I apologized to him for this error.  ? ?Patient was notified that he would need to pay his balance before seeing him again due to being out of network he will be billed higher. Patient stated that he is unable to pay this balance and has no money to pay the balance. Lemmie Evens, CMA for Dr. Henrene Pastor, was notified of the situation. ? ?Marla and I spoke with the patient regarding the situation. Marla, CMA told that he needs to be seen at the hospital, patient refused. He stated something could happen between here and his house. Dr. Henrene Pastor was notified.  ? ?I spoke with Damon, Engineer, building services regarding the situation.  ?Damon stated that [facility] is not in network with the insurance he will probably have to be seen [facility]. He stated to offer to the patient that we can see him today as self-pay but he will need to pay $100 for today's DOS or we can call 911 to have EMS to take him to a hospital. The patient refused to be seen today as self-pay and refused to go to [facility] due to them not coding a procedure correctly in the past for him and he had to pay them a high balance. Marla, CMA, offered to call [facility] to verify that they take his insurance. But the patient refused. He stated that he was going to call the insurance company to find out why he was sold an insurance  that is not in network with his PCP.  ? ?Dr. Henrene Pastor and Damon, Practice Administrator has been notified.  ?

## 2022-05-25 ENCOUNTER — Ambulatory Visit: Payer: Medicare HMO | Admitting: Legal Medicine

## 2022-06-03 ENCOUNTER — Telehealth: Payer: Medicare HMO

## 2022-08-04 ENCOUNTER — Other Ambulatory Visit: Payer: Self-pay | Admitting: Legal Medicine

## 2022-08-04 DIAGNOSIS — G6289 Other specified polyneuropathies: Secondary | ICD-10-CM

## 2022-09-02 ENCOUNTER — Other Ambulatory Visit: Payer: Self-pay | Admitting: Legal Medicine

## 2022-09-02 DIAGNOSIS — E038 Other specified hypothyroidism: Secondary | ICD-10-CM

## 2022-09-02 DIAGNOSIS — T7840XD Allergy, unspecified, subsequent encounter: Secondary | ICD-10-CM

## 2022-09-21 ENCOUNTER — Other Ambulatory Visit: Payer: Self-pay | Admitting: Legal Medicine

## 2022-09-21 DIAGNOSIS — I4819 Other persistent atrial fibrillation: Secondary | ICD-10-CM

## 2022-09-21 DIAGNOSIS — E782 Mixed hyperlipidemia: Secondary | ICD-10-CM

## 2022-12-09 ENCOUNTER — Other Ambulatory Visit: Payer: Self-pay

## 2022-12-09 DIAGNOSIS — Z9884 Bariatric surgery status: Secondary | ICD-10-CM

## 2022-12-09 DIAGNOSIS — G894 Chronic pain syndrome: Secondary | ICD-10-CM

## 2023-03-28 ENCOUNTER — Other Ambulatory Visit: Payer: Self-pay

## 2023-05-16 ENCOUNTER — Other Ambulatory Visit: Payer: Self-pay

## 2023-05-16 DIAGNOSIS — G894 Chronic pain syndrome: Secondary | ICD-10-CM

## 2023-05-16 DIAGNOSIS — E038 Other specified hypothyroidism: Secondary | ICD-10-CM

## 2023-05-16 DIAGNOSIS — Z9884 Bariatric surgery status: Secondary | ICD-10-CM

## 2023-05-16 DIAGNOSIS — T7840XD Allergy, unspecified, subsequent encounter: Secondary | ICD-10-CM

## 2023-05-16 MED ORDER — METOPROLOL TARTRATE 25 MG PO TABS: 25.0000 mg | ORAL_TABLET | Freq: Two times a day (BID) | ORAL | 2 refills | Status: AC

## 2023-05-16 MED ORDER — TIZANIDINE HCL 4 MG PO TABS: 4.0000 mg | ORAL_TABLET | Freq: Three times a day (TID) | ORAL | 2 refills | Status: DC

## 2023-05-16 MED ORDER — FAMOTIDINE 40 MG PO TABS: 40.0000 mg | ORAL_TABLET | Freq: Two times a day (BID) | ORAL | 2 refills | Status: AC

## 2023-05-16 MED ORDER — MONTELUKAST SODIUM 10 MG PO TABS: 10.0000 mg | ORAL_TABLET | Freq: Every evening | ORAL | 1 refills | Status: DC

## 2023-05-16 MED ORDER — LEVOTHYROXINE SODIUM 175 MCG PO TABS: 175.0000 ug | ORAL_TABLET | Freq: Every day | ORAL | 2 refills | Status: DC

## 2023-07-27 ENCOUNTER — Other Ambulatory Visit: Payer: Self-pay

## 2023-07-29 ENCOUNTER — Other Ambulatory Visit: Payer: Self-pay

## 2023-08-18 ENCOUNTER — Other Ambulatory Visit: Payer: Self-pay

## 2023-08-18 NOTE — Telephone Encounter (Signed)
Received refill request from [facility] in [facility] for Pravastatin 80 mg and amitriptyline 10 mg.  It appears that Antonio Evans has transferred his care to [place].  I called and left a message for him to call the office for an appointment if he wishes [place] to refill his medication.

## 2023-08-23 ENCOUNTER — Other Ambulatory Visit: Payer: Self-pay

## 2023-11-08 ENCOUNTER — Other Ambulatory Visit: Payer: Self-pay | Admitting: Family Medicine

## 2023-11-08 DIAGNOSIS — T7840XD Allergy, unspecified, subsequent encounter: Secondary | ICD-10-CM

## 2023-12-07 ENCOUNTER — Other Ambulatory Visit: Payer: Self-pay

## 2023-12-07 DIAGNOSIS — E782 Mixed hyperlipidemia: Secondary | ICD-10-CM

## 2024-01-16 ENCOUNTER — Other Ambulatory Visit: Payer: Self-pay | Admitting: Family Medicine

## 2024-01-16 DIAGNOSIS — G894 Chronic pain syndrome: Secondary | ICD-10-CM

## 2024-01-19 ENCOUNTER — Other Ambulatory Visit: Payer: Self-pay

## 2024-01-19 DIAGNOSIS — G6289 Other specified polyneuropathies: Secondary | ICD-10-CM

## 2024-01-19 MED ORDER — AMITRIPTYLINE HCL 10 MG PO TABS: 10.0000 mg | ORAL_TABLET | Freq: Every day | ORAL | 2 refills | Status: DC

## 2024-01-19 NOTE — Telephone Encounter (Signed)
Denied Klon-con as patient needs appointment for any refills.

## 2024-01-19 NOTE — Telephone Encounter (Signed)
Deined refill for Amitriptyline HCL 10 mg as patient needs appointment for any refills.

## 2024-02-29 ENCOUNTER — Other Ambulatory Visit: Payer: Self-pay | Admitting: Family Medicine

## 2024-02-29 DIAGNOSIS — Z9884 Bariatric surgery status: Secondary | ICD-10-CM

## 2024-03-09 ENCOUNTER — Other Ambulatory Visit: Payer: Self-pay | Admitting: Family Medicine

## 2024-03-09 DIAGNOSIS — Z9884 Bariatric surgery status: Secondary | ICD-10-CM

## 2024-03-17 ENCOUNTER — Other Ambulatory Visit: Payer: Self-pay | Admitting: Family Medicine

## 2024-03-17 DIAGNOSIS — Z9884 Bariatric surgery status: Secondary | ICD-10-CM

## 2024-03-20 ENCOUNTER — Other Ambulatory Visit: Payer: Self-pay

## 2024-03-20 DIAGNOSIS — G6289 Other specified polyneuropathies: Secondary | ICD-10-CM

## 2024-03-29 ENCOUNTER — Other Ambulatory Visit: Payer: Self-pay | Admitting: Family Medicine

## 2024-03-29 DIAGNOSIS — Z9884 Bariatric surgery status: Secondary | ICD-10-CM

## 2024-04-20 ENCOUNTER — Other Ambulatory Visit: Payer: Self-pay

## 2024-04-20 DIAGNOSIS — G6289 Other specified polyneuropathies: Secondary | ICD-10-CM

## 2024-06-23 ENCOUNTER — Other Ambulatory Visit: Payer: Self-pay | Admitting: Family Medicine

## 2024-06-23 DIAGNOSIS — E038 Other specified hypothyroidism: Secondary | ICD-10-CM

## 2024-07-04 ENCOUNTER — Other Ambulatory Visit: Payer: Self-pay | Admitting: Family Medicine

## 2024-07-04 DIAGNOSIS — T7840XD Allergy, unspecified, subsequent encounter: Secondary | ICD-10-CM
